# Patient Record
Sex: Male | Born: 1975 | Race: Black or African American | Hispanic: No | Marital: Single | Smoking: Current every day smoker
Health system: Southern US, Community
[De-identification: ages and names within clinical notes are randomized; demographics above are authoritative.]

## PROBLEM LIST (undated history)

## (undated) DIAGNOSIS — E119 Type 2 diabetes mellitus without complications: Secondary | ICD-10-CM

## (undated) DIAGNOSIS — I10 Essential (primary) hypertension: Secondary | ICD-10-CM

## (undated) DIAGNOSIS — E785 Hyperlipidemia, unspecified: Secondary | ICD-10-CM

---

## 2001-12-11 ENCOUNTER — Encounter: Payer: Self-pay | Admitting: *Deleted

## 2001-12-11 ENCOUNTER — Emergency Department (HOSPITAL_COMMUNITY): Admission: EM | Admit: 2001-12-11 | Discharge: 2001-12-12 | Payer: Self-pay | Admitting: *Deleted

## 2001-12-28 ENCOUNTER — Emergency Department (HOSPITAL_COMMUNITY): Admission: EM | Admit: 2001-12-28 | Discharge: 2001-12-28 | Payer: Self-pay | Admitting: Emergency Medicine

## 2002-06-27 ENCOUNTER — Emergency Department (HOSPITAL_COMMUNITY): Admission: EM | Admit: 2002-06-27 | Discharge: 2002-06-27 | Payer: Self-pay | Admitting: Emergency Medicine

## 2002-07-15 ENCOUNTER — Emergency Department (HOSPITAL_COMMUNITY): Admission: EM | Admit: 2002-07-15 | Discharge: 2002-07-15 | Payer: Self-pay | Admitting: Emergency Medicine

## 2002-07-15 ENCOUNTER — Encounter: Payer: Self-pay | Admitting: Radiology

## 2002-07-18 ENCOUNTER — Ambulatory Visit (HOSPITAL_COMMUNITY): Admission: RE | Admit: 2002-07-18 | Discharge: 2002-07-18 | Payer: Self-pay | Admitting: Oral Surgery

## 2005-05-18 ENCOUNTER — Emergency Department (HOSPITAL_COMMUNITY): Admission: EM | Admit: 2005-05-18 | Discharge: 2005-05-18 | Payer: Self-pay | Admitting: Emergency Medicine

## 2008-03-25 ENCOUNTER — Emergency Department (HOSPITAL_COMMUNITY): Admission: EM | Admit: 2008-03-25 | Discharge: 2008-03-25 | Payer: Self-pay | Admitting: Emergency Medicine

## 2008-05-30 ENCOUNTER — Emergency Department (HOSPITAL_COMMUNITY): Admission: EM | Admit: 2008-05-30 | Discharge: 2008-05-30 | Payer: Self-pay | Admitting: Emergency Medicine

## 2008-10-20 ENCOUNTER — Emergency Department (HOSPITAL_COMMUNITY): Admission: EM | Admit: 2008-10-20 | Discharge: 2008-10-20 | Payer: Self-pay | Admitting: Emergency Medicine

## 2009-06-20 ENCOUNTER — Emergency Department (HOSPITAL_COMMUNITY): Admission: EM | Admit: 2009-06-20 | Discharge: 2009-06-20 | Payer: Self-pay | Admitting: Emergency Medicine

## 2010-01-13 ENCOUNTER — Emergency Department (HOSPITAL_COMMUNITY): Admission: EM | Admit: 2010-01-13 | Discharge: 2010-01-13 | Payer: Self-pay | Admitting: Emergency Medicine

## 2010-02-23 ENCOUNTER — Emergency Department (HOSPITAL_COMMUNITY): Admission: EM | Admit: 2010-02-23 | Discharge: 2010-02-23 | Payer: Self-pay | Admitting: Emergency Medicine

## 2010-05-18 ENCOUNTER — Emergency Department (HOSPITAL_COMMUNITY): Admission: EM | Admit: 2010-05-18 | Discharge: 2010-05-18 | Payer: Self-pay | Admitting: Emergency Medicine

## 2010-08-06 ENCOUNTER — Emergency Department (HOSPITAL_COMMUNITY)
Admission: EM | Admit: 2010-08-06 | Discharge: 2010-08-06 | Payer: Self-pay | Source: Home / Self Care | Admitting: Emergency Medicine

## 2010-08-19 ENCOUNTER — Emergency Department (HOSPITAL_COMMUNITY): Admission: EM | Admit: 2010-08-19 | Discharge: 2010-08-19 | Payer: Self-pay | Admitting: Emergency Medicine

## 2011-02-08 LAB — URINALYSIS, ROUTINE W REFLEX MICROSCOPIC
Bilirubin Urine: NEGATIVE
Glucose, UA: NEGATIVE mg/dL
Hgb urine dipstick: NEGATIVE
Ketones, ur: NEGATIVE mg/dL
pH: 6 (ref 5.0–8.0)

## 2011-02-11 LAB — BASIC METABOLIC PANEL
BUN: 6 mg/dL (ref 6–23)
BUN: 10 mg/dL (ref 6–23)
CO2: 26 mEq/L (ref 19–32)
CO2: 25 mEq/L (ref 19–32)
Calcium: 9.5 mg/dL (ref 8.4–10.5)
Chloride: 103 mEq/L (ref 96–112)
Creatinine, Ser: 1.09 mg/dL (ref 0.4–1.5)
GFR calc non Af Amer: >60 mL/min (ref >60)
Glucose, Bld: 89 mg/dL (ref 70–99)
Glucose, Bld: 93 mg/dL (ref 70–99)
Potassium: 3.1 mEq/L — ABNORMAL LOW (ref 3.5–5.1)
Potassium: 3.6 mEq/L (ref 3.5–5.1)

## 2011-02-11 LAB — CBC
HCT: 44.8 % (ref 39.0–52.0)
HCT: 44.8 % (ref 39.0–52.0)
Hemoglobin: 15.5 g/dL (ref 13.0–17.0)
MCHC: 34.5 g/dL (ref 30.0–36.0)
MCHC: 33.7 g/dL (ref 30.0–36.0)
MCV: 90.7 fL (ref 78.0–100.0)
Platelets: 152 10*3/uL (ref 150–400)
RDW: 13.4 % (ref 11.5–15.5)
RDW: 13.5 % (ref 11.5–15.5)

## 2011-02-11 LAB — ETHANOL: Alcohol, Ethyl (B): 143 mg/dL — ABNORMAL HIGH (ref 0–10)

## 2011-02-11 LAB — DIFFERENTIAL
Basophils Absolute: 0.1 10*3/uL (ref 0.0–0.1)
Basophils Relative: 1 % (ref 0–1)
Basophils Relative: 2 % — ABNORMAL HIGH (ref 0–1)
Eosinophils Absolute: 0.1 10*3/uL (ref 0.0–0.7)
Eosinophils Relative: 1 % (ref 0–5)
Eosinophils Relative: 1 % (ref 0–5)
Monocytes Absolute: 1 10*3/uL (ref 0.1–1.0)
Monocytes Relative: 9 % (ref 3–12)
Neutrophils Relative %: 45 % (ref 43–77)

## 2011-02-11 LAB — RAPID URINE DRUG SCREEN, HOSP PERFORMED
Barbiturates: NOT DETECTED
Barbiturates: NOT DETECTED
Benzodiazepines: NOT DETECTED
Cocaine: POSITIVE — AB
Cocaine: POSITIVE — AB
Opiates: NOT DETECTED
Opiates: NOT DETECTED
Tetrahydrocannabinol: NOT DETECTED

## 2011-02-11 LAB — POCT CARDIAC MARKERS
CKMB, poc: 2.2 ng/mL (ref 1.0–8.0)
Troponin i, poc: <0.05 ng/mL (ref 0.00–0.09)

## 2011-03-01 LAB — URINALYSIS, ROUTINE W REFLEX MICROSCOPIC
Glucose, UA: NEGATIVE mg/dL
Ketones, ur: NEGATIVE mg/dL
Leukocytes, UA: NEGATIVE
Protein, ur: 100 mg/dL — AB
Urobilinogen, UA: 0.2 mg/dL (ref 0.0–1.0)

## 2011-03-01 LAB — DIFFERENTIAL
Basophils Relative: 0 % (ref 0–1)
Eosinophils Absolute: 0 10*3/uL (ref 0.0–0.7)
Eosinophils Relative: 0 % (ref 0–5)
Lymphs Abs: 1.4 10*3/uL (ref 0.7–4.0)
Monocytes Relative: 9 % (ref 3–12)

## 2011-03-01 LAB — CBC
HCT: 44 % (ref 39.0–52.0)
MCHC: 34.6 g/dL (ref 30.0–36.0)
MCV: 88.8 fL (ref 78.0–100.0)
RBC: 4.95 MIL/uL (ref 4.22–5.81)
WBC: 10.2 10*3/uL (ref 4.0–10.5)

## 2011-03-01 LAB — URINE MICROSCOPIC-ADD ON

## 2011-03-01 LAB — BASIC METABOLIC PANEL
BUN: 11 mg/dL (ref 6–23)
CO2: 25 mEq/L (ref 19–32)
Chloride: 110 mEq/L (ref 96–112)
Creatinine, Ser: 1.54 mg/dL — ABNORMAL HIGH (ref 0.4–1.5)
GFR calc Af Amer: >60 mL/min (ref >60)
Potassium: 3.4 mEq/L — ABNORMAL LOW (ref 3.5–5.1)

## 2011-04-10 NOTE — Op Note (Signed)
NAME:  Cristian White, Cristian White                           ACCOUNT NO.:  [id number]   MEDICAL RECORD NO.:  [id number]                   PATIENT TYPE:  OIB   LOCATION:  2899                                 FACILITY:  [facility]   PHYSICIAN:  Georgia Lopes, M.D.               DATE OF BIRTH:  27-Dec-1975   DATE OF PROCEDURE:  07/18/2002  DATE OF DISCHARGE:  07/18/2002                                 OPERATIVE REPORT   PREOPERATIVE DIAGNOSES:  Fractured right mandible, subcondylar,  nonrestorable tooth #31.   POSTOPERATIVE DIAGNOSES:  Fractured right mandible, subcondylar,  nonrestorable tooth #31, nonrestorable impacted tooth #32.   OPERATION PERFORMED:  Removal of teeth numbers 31, 32, closed reduction of  mandible fracture.   SURGEON:  Georgia Lopes, M.D.   ANESTHESIA:  General nasal.   ANESTHESIOLOGIST:  Cliffton Asters. Crews, M.D.   DESCRIPTION OF PROCEDURE:  The patient was taken to the operating room and  placed on the table in supine position.  General anesthesia was administered  intravenously and nasal endotracheal tube was placed atraumatically.  The  eyes were lubricated and protected.  A throat pack was placed and the  patient was prepped and draped for surgery.  Lidocaine 2% 1:100,000  epinephrine was infiltrated in an inferior alveolar block on the right and  left sides and then buccal infiltration of the maxilla.  A total of 5  carpules were used.  1.8 cc per carpule.  A bite block was placed on the  left side of the mouth.  Attention was turned to tooth #31.  A #15 blade was  used to make a full thickness incision around this tooth.  The periosteum  was reflected over the periosteal elevator.  The tooth was elevated with a  301 elevator and was removed from the mouth with a #17 forcep.  After the  tooth was removed, the socket was curetted.  It was noted that tooth #32 was  horizontally impacted and a large portion of the crown was decayed  subgingivally.  It was therefore elected  at this time to remove this tooth.  A  #15 blade was used to make a full thickness incision overlying the tooth,  the periosteum was reflected and then a 701 drill was used to remove bone  around the tooth.  The tooth was removed in multiple sections.  The socket  was then curetted and irrigated and the area was closed with 3-0 chromic.  Then arch bars were __________ the upper arch using 24 and 26 gauge wires.  24 gauge wires were used in the molars, canines and premolars and 26 gauge  wire was used in the anterior teeth.  Then an arch bar was adapted to the  lower jaw in the same fashion.  Then the mandible was manually distracted  inferiorly at the right angle and lesser curvature area of the mandible in  an  attempt to manipulate and reduce a condylar fracture.  This maneuver was  performed several times.  Bite was opened and closed and the teeth were  found to go into occlusion properly.  Then the oral cavity was irrigated  copiously and the throat pack was removed.  Elastic fixation was used times  approximately 12 to 15 rubber bands to close the mouth into intermaxillary  fixation.  The patient was then awakened and taken to the recovery room  breathing spontaneously and in good condition.   ESTIMATED BLOOD LOSS:  Minimum.    COMPLICATIONS:  None.   SPECIMENS:  None.                                                 Georgia Lopes, M.D.    SMJ/MEDQ  D:  07/18/2002  T:  07/20/2002  Job:  [id number]

## 2011-12-22 ENCOUNTER — Encounter (HOSPITAL_COMMUNITY): Payer: Self-pay | Admitting: Emergency Medicine

## 2011-12-22 ENCOUNTER — Emergency Department (HOSPITAL_COMMUNITY): Payer: Self-pay

## 2011-12-22 ENCOUNTER — Emergency Department (HOSPITAL_COMMUNITY)
Admission: EM | Admit: 2011-12-22 | Discharge: 2011-12-22 | Disposition: A | Discharge status: Home / Self Care | Payer: Self-pay | Attending: Emergency Medicine | Admitting: Emergency Medicine

## 2011-12-22 DIAGNOSIS — F17200 Nicotine dependence, unspecified, uncomplicated: Secondary | ICD-10-CM | POA: Insufficient documentation

## 2011-12-22 DIAGNOSIS — W260XXA Contact with knife, initial encounter: Secondary | ICD-10-CM | POA: Insufficient documentation

## 2011-12-22 DIAGNOSIS — Z23 Encounter for immunization: Secondary | ICD-10-CM | POA: Insufficient documentation

## 2011-12-22 DIAGNOSIS — S51819A Laceration without foreign body of unspecified forearm, initial encounter: Secondary | ICD-10-CM

## 2011-12-22 DIAGNOSIS — S51809A Unspecified open wound of unspecified forearm, initial encounter: Secondary | ICD-10-CM | POA: Insufficient documentation

## 2011-12-22 DIAGNOSIS — W261XXA Contact with sword or dagger, initial encounter: Secondary | ICD-10-CM | POA: Insufficient documentation

## 2011-12-22 MED ORDER — LIDOCAINE HCL (PF) 1 % IJ SOLN
INTRAMUSCULAR | Status: AC
  Filled 2011-12-22: qty 5

## 2011-12-22 MED ORDER — HYDROMORPHONE HCL PF 1 MG/ML IJ SOLN
INTRAMUSCULAR | Status: AC
  Administered 2011-12-22: 1 mg
  Filled 2011-12-22: qty 1

## 2011-12-22 MED ORDER — ONDANSETRON HCL 4 MG/2ML IJ SOLN
INTRAMUSCULAR | Status: AC
  Administered 2011-12-22: 19:00:00
  Filled 2011-12-22: qty 2

## 2011-12-22 MED ORDER — HYDROMORPHONE HCL PF 1 MG/ML IJ SOLN
1.0000 mg | Freq: Once | INTRAMUSCULAR | Status: AC
  Administered 2011-12-22: 1 mg via INTRAVENOUS
  Filled 2011-12-22: qty 1

## 2011-12-22 MED ORDER — CEFAZOLIN SODIUM 1-5 GM-% IV SOLN
1.0000 g | Freq: Three times a day (TID) | INTRAVENOUS | Status: DC
  Administered 2011-12-22: 1 g via INTRAVENOUS
  Filled 2011-12-22: qty 50

## 2011-12-22 MED ORDER — BACITRACIN ZINC 500 UNIT/GM EX OINT
TOPICAL_OINTMENT | CUTANEOUS | Status: AC
  Filled 2011-12-22: qty 0.9

## 2011-12-22 MED ORDER — LIDOCAINE HCL (PF) 1 % IJ SOLN
INTRAMUSCULAR | Status: AC
  Filled 2011-12-22: qty 10

## 2011-12-22 MED ORDER — TETANUS-DIPHTH-ACELL PERTUSSIS 5-2.5-18.5 LF-MCG/0.5 IM SUSP
0.5000 mL | Freq: Once | INTRAMUSCULAR | Status: AC
  Administered 2011-12-22: 0.5 mL via INTRAMUSCULAR
  Filled 2011-12-22 (×2): qty 0.5

## 2011-12-22 MED ORDER — CEPHALEXIN 500 MG PO CAPS: 500.0000 mg | ORAL_CAPSULE | Freq: Four times a day (QID) | ORAL | Status: AC

## 2011-12-22 MED ORDER — OXYCODONE-ACETAMINOPHEN 5-325 MG PO TABS: 1.0000 | ORAL_TABLET | Freq: Four times a day (QID) | ORAL | Status: AC | PRN

## 2011-12-22 NOTE — ED Provider Notes (Signed)
History   CSN: [id number]  Arrival date & time 12/22/11  1751   First MD Initiated Contact with Patient 12/22/11 1846      Chief Complaint  Patient presents with  . Laceration    (Consider location/radiation/quality/duration/timing/severity/associated sxs/prior treatment) HPI Comments: Was cutting gutters with utility knife which slipped and caused a large laceration to the forearm.  Last tetanus unknown.    Patient is a 36 y.o. male presenting with skin laceration. The history is provided by the patient.  Laceration  The incident occurred less than 1 hour ago. The laceration is located on the left arm. The laceration is 10 cm in size. The laceration mechanism was a a clean knife. The pain is severe. The pain has been constant since onset. He reports no foreign bodies present. His tetanus status is out of date.   Cristian White is a 36 y.o. male who presents to the Emergency Department complaining of laceration to the left arm.  History reviewed. No pertinent past medical history.  History reviewed. No pertinent past surgical history.  History reviewed. No pertinent family history.  History  Substance Use Topics  . Smoking status: Current Everyday Smoker  . Smokeless tobacco: Not on file  . Alcohol Use: Yes      Review of Systems  All other systems reviewed and are negative.   10 Systems reviewed and are negative for acute change except as noted in the HPI.  Allergies  Tylenol pm extra  Home Medications  No current outpatient prescriptions on file.  BP 121/84  Pulse 84  Temp 98.1 F (36.7 C)  Resp 21  Ht 5\' 5"  (1.651 m)  Wt 180 lb (81.647 kg)  BMI 29.95 kg/m2  SpO2 100%  Physical Exam  Nursing note and vitals reviewed. Constitutional: He is oriented to person, place, and time. He appears well-developed and well-nourished.  HENT:  Head: Normocephalic and atraumatic.  Neck: Normal range of motion. Neck supple.  Musculoskeletal:       The left forearm has  a large laceration on the volar aspect.  It is gaping and extends to, but not involving the musculature, vasculature, or tendons of the forearm.  He has full strength and range of motion of all fingers, thumb, wrist.  The ulnar and radial pulses are easily palpable, and sensation and motor are intact in the distal extremity.  Neurological: He is alert and oriented to person, place, and time.  Skin: Skin is warm and dry.    ED Course  Procedures (including critical care time)  DIAGNOSTIC STUDIES: Oxygen Saturation is 99% on room air, normal by my interpretation.    COORDINATION OF CARE:    Labs Reviewed - No data to display Dg Forearm Left  12/22/2011  *RADIOLOGY REPORT*  Clinical Data: Left arm laceration.  LEFT FOREARM - 2 VIEW  Comparison: 08/06/2010 hand radiographs.  Findings: No radiopaque foreign body.  No fracture.  Bandage material is present over the volar aspect of the mid forearm. Lucency is present in the soft tissues compatible with gas from laceration.  IMPRESSION: No acute osseous abnormality.  Volar forearm laceration.  Original Report Authenticated By: Andreas Newport, M.D.     No diagnosis found.  LACERATION REPAIR Performed by: Geoffery Lyons Authorized by: Geoffery Lyons Consent: Verbal consent obtained. Risks and benefits: risks, benefits and alternatives were discussed Consent given by: patient Patient identity confirmed: provided demographic data Prepped and Draped in normal sterile fashion Wound explored  Laceration Location: left forearm  Laceration Length: 10 cm  No Foreign Bodies seen or palpated  Anesthesia: local infiltration  Local anesthetic: lidocaine 1% without epinephrine  Anesthetic total: 10 ml  Irrigation method: syringe Amount of cleaning: standard  Skin closure: ethilon 4-0  Number of sutures: 15  Technique: simple interrupted  Patient tolerance: Patient tolerated the procedure well with no immediate complications.   MDM    Will discharge with keflex, pain meds and local wound care.  He is to have sutures removed in 2 weeks.           Geoffery Lyons, MD 12/22/11 2020

## 2011-12-22 NOTE — ED Notes (Signed)
Pt verbalizes understanding on wound care questions answered by RN and MD

## 2011-12-22 NOTE — ED Notes (Signed)
Education provided to pt about wound care and dressing instruction. Wound cleaning, signs/sx of infection provided. Pt verbalized understanding

## 2011-12-22 NOTE — ED Notes (Signed)
Pt accidentally cut left arm with straight razor. Pt able to make fist -c/o numbness in last two fingers.

## 2013-11-11 ENCOUNTER — Emergency Department (HOSPITAL_COMMUNITY)
Admission: EM | Admit: 2013-11-11 | Discharge: 2013-11-11 | Disposition: A | Discharge status: Home / Self Care | Payer: PRIVATE HEALTH INSURANCE | Attending: Emergency Medicine | Admitting: Emergency Medicine

## 2013-11-11 ENCOUNTER — Emergency Department (HOSPITAL_COMMUNITY): Payer: PRIVATE HEALTH INSURANCE

## 2013-11-11 ENCOUNTER — Encounter (HOSPITAL_COMMUNITY): Payer: Self-pay | Admitting: Emergency Medicine

## 2013-11-11 DIAGNOSIS — S20229A Contusion of unspecified back wall of thorax, initial encounter: Secondary | ICD-10-CM | POA: Insufficient documentation

## 2013-11-11 DIAGNOSIS — S20219A Contusion of unspecified front wall of thorax, initial encounter: Secondary | ICD-10-CM | POA: Insufficient documentation

## 2013-11-11 DIAGNOSIS — S0033XA Contusion of nose, initial encounter: Secondary | ICD-10-CM

## 2013-11-11 DIAGNOSIS — S20222A Contusion of left back wall of thorax, initial encounter: Secondary | ICD-10-CM

## 2013-11-11 DIAGNOSIS — S0083XA Contusion of other part of head, initial encounter: Secondary | ICD-10-CM

## 2013-11-11 DIAGNOSIS — F17200 Nicotine dependence, unspecified, uncomplicated: Secondary | ICD-10-CM | POA: Insufficient documentation

## 2013-11-11 DIAGNOSIS — S0003XA Contusion of scalp, initial encounter: Secondary | ICD-10-CM | POA: Insufficient documentation

## 2013-11-11 LAB — URINALYSIS, ROUTINE W REFLEX MICROSCOPIC
Bilirubin Urine: NEGATIVE
Hgb urine dipstick: NEGATIVE
Nitrite: NEGATIVE
Specific Gravity, Urine: <1.005 — ABNORMAL LOW (ref 1.005–1.030)
pH: 5.5 (ref 5.0–8.0)

## 2013-11-11 MED ORDER — IBUPROFEN 800 MG PO TABS
800.0000 mg | ORAL_TABLET | Freq: Once | ORAL | Status: AC
  Administered 2013-11-11: 800 mg via ORAL
  Filled 2013-11-11: qty 1

## 2013-11-11 MED ORDER — TRAMADOL-ACETAMINOPHEN 37.5-325 MG PO TABS: ORAL_TABLET | ORAL | Status: DC

## 2013-11-11 MED ORDER — METHOCARBAMOL 500 MG PO TABS: ORAL_TABLET | ORAL | Status: DC

## 2013-11-11 MED ORDER — NAPROXEN 500 MG PO TABS: 500.0000 mg | ORAL_TABLET | Freq: Two times a day (BID) | ORAL | Status: DC

## 2013-11-11 MED ORDER — TRAMADOL HCL 50 MG PO TABS
100.0000 mg | ORAL_TABLET | Freq: Once | ORAL | Status: AC
  Administered 2013-11-11: 100 mg via ORAL
  Filled 2013-11-11: qty 2

## 2013-11-11 MED ORDER — ACETAMINOPHEN 500 MG PO TABS
1000.0000 mg | ORAL_TABLET | Freq: Once | ORAL | Status: AC
  Administered 2013-11-11: 1000 mg via ORAL
  Filled 2013-11-11: qty 2
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
## 2013-11-11 NOTE — ED Notes (Signed)
Pt instructed on need for urine sample and verbalized understanding

## 2013-12-08 ENCOUNTER — Emergency Department (HOSPITAL_COMMUNITY)
Admission: EM | Admit: 2013-12-08 | Discharge: 2013-12-08 | Disposition: A | Discharge status: Home / Self Care | Payer: PRIVATE HEALTH INSURANCE | Attending: Emergency Medicine | Admitting: Emergency Medicine

## 2013-12-08 ENCOUNTER — Emergency Department (HOSPITAL_COMMUNITY): Payer: PRIVATE HEALTH INSURANCE

## 2013-12-08 ENCOUNTER — Encounter (HOSPITAL_COMMUNITY): Payer: Self-pay | Admitting: Emergency Medicine

## 2013-12-08 DIAGNOSIS — F17200 Nicotine dependence, unspecified, uncomplicated: Secondary | ICD-10-CM | POA: Insufficient documentation

## 2013-12-08 DIAGNOSIS — Z791 Long term (current) use of non-steroidal anti-inflammatories (NSAID): Secondary | ICD-10-CM | POA: Insufficient documentation

## 2013-12-08 DIAGNOSIS — R112 Nausea with vomiting, unspecified: Secondary | ICD-10-CM | POA: Insufficient documentation

## 2013-12-08 LAB — CBC WITH DIFFERENTIAL/PLATELET
BASOS ABS: 0 10*3/uL (ref 0.0–0.1)
BASOS PCT: 0 % (ref 0–1)
EOS PCT: 0 % (ref 0–5)
Eosinophils Absolute: 0 10*3/uL (ref 0.0–0.7)
HEMATOCRIT: 44.2 % (ref 39.0–52.0)
Hemoglobin: 15.9 g/dL (ref 13.0–17.0)
Lymphocytes Relative: 34 % (ref 12–46)
Lymphs Abs: 3.7 10*3/uL (ref 0.7–4.0)
MCH: 31.9 pg (ref 26.0–34.0)
MCHC: 36 g/dL (ref 30.0–36.0)
MCV: 88.6 fL (ref 78.0–100.0)
MONO ABS: 1.2 10*3/uL — AB (ref 0.1–1.0)
Monocytes Relative: 10 % (ref 3–12)
NEUTROS ABS: 6.2 10*3/uL (ref 1.7–7.7)
Neutrophils Relative %: 56 % (ref 43–77)
PLATELETS: 151 10*3/uL (ref 150–400)
RBC: 4.99 MIL/uL (ref 4.22–5.81)
RDW: 12.9 % (ref 11.5–15.5)
WBC: 11.1 10*3/uL — ABNORMAL HIGH (ref 4.0–10.5)

## 2013-12-08 LAB — COMPREHENSIVE METABOLIC PANEL
ALBUMIN: 4.6 g/dL (ref 3.5–5.2)
ALT: 23 U/L (ref 0–53)
AST: 25 U/L (ref 0–37)
Alkaline Phosphatase: 75 U/L (ref 39–117)
BUN: 10 mg/dL (ref 6–23)
CALCIUM: 9.9 mg/dL (ref 8.4–10.5)
CHLORIDE: 99 meq/L (ref 96–112)
CO2: 27 mEq/L (ref 19–32)
CREATININE: 1.1 mg/dL (ref 0.50–1.35)
GFR calc Af Amer: >90 mL/min (ref >90)
GFR calc non Af Amer: 85 mL/min — ABNORMAL LOW (ref >90)
Glucose, Bld: 82 mg/dL (ref 70–99)
Potassium: 4.4 mEq/L (ref 3.7–5.3)
SODIUM: 142 meq/L (ref 137–147)
Total Bilirubin: 1.2 mg/dL (ref 0.3–1.2)
Total Protein: 7.8 g/dL (ref 6.0–8.3)

## 2013-12-08 LAB — LIPASE, BLOOD: Lipase: 17 U/L (ref 11–59)

## 2013-12-08 MED ORDER — ONDANSETRON 8 MG PO TBDP
8.0000 mg | ORAL_TABLET | Freq: Once | ORAL | Status: AC
  Administered 2013-12-08: 8 mg via ORAL
  Filled 2013-12-08: qty 1

## 2013-12-08 MED ORDER — ONDANSETRON HCL 4 MG PO TABS: 4.0000 mg | ORAL_TABLET | Freq: Three times a day (TID) | ORAL | Status: DC | PRN

## 2013-12-08 NOTE — ED Provider Notes (Signed)
CSN: [id number]     Arrival date & time 12/08/13  1824 History   First MD Initiated Contact with Patient 12/08/13 2037     Chief Complaint  Patient presents with  . Emesis    HPI Pt was seen at 2035. Per pt, c/o gradual onset and persistence of multiple intermittent episodes of N/V that began this morning. States he has been able to tolerate a small amount PO in between N/V episodes. Denies diarrhea, no abd pain, no CP/SOB, no back pain, no fevers, no black or blood in stools or emesis.     History reviewed. No pertinent past medical history.  History reviewed. No pertinent past surgical history.  History  Substance Use Topics  . Smoking status: Current Every Day Smoker    Types: Cigarettes  . Smokeless tobacco: Not on file  . Alcohol Use: Yes    Review of Systems ROS: Statement: All systems negative except as marked or noted in the HPI; Constitutional: Negative for fever and chills. ; ; Eyes: Negative for eye pain, redness and discharge. ; ; ENMT: Negative for ear pain, hoarseness, nasal congestion, sinus pressure and sore throat. ; ; Cardiovascular: Negative for chest pain, palpitations, diaphoresis, dyspnea and peripheral edema. ; ; Respiratory: Negative for cough, wheezing and stridor. ; ; Gastrointestinal: +N/V. Negative for diarrhea, abdominal pain, blood in stool, hematemesis, jaundice and rectal bleeding. . ; ; Genitourinary: Negative for dysuria, flank pain and hematuria. ; ; Musculoskeletal: Negative for back pain and neck pain. Negative for swelling and trauma.; ; Skin: Negative for pruritus, rash, abrasions, blisters, bruising and skin lesion.; ; Neuro: Negative for headache, lightheadedness and neck stiffness. Negative for weakness, altered level of consciousness , altered mental status, extremity weakness, paresthesias, involuntary movement, seizure and syncope.       Allergies  Diphenhydramine-apap (sleep)  Home Medications   Current Outpatient Rx  Name  Route  Sig   Dispense  Refill  . methocarbamol (ROBAXIN) 500 MG tablet      Take 2 po QID for muscle soreness or spasm   60 tablet   0   . naproxen (NAPROSYN) 500 MG tablet   Oral   Take 1 tablet (500 mg total) by mouth 2 (two) times daily.   30 tablet   0   . traMADol-acetaminophen (ULTRACET) 37.5-325 MG per tablet      2 tabs po QID prn pain   16 tablet   0    BP 130/78  Pulse 97  Temp(Src) 98.6 F (37 C)  Resp 20  Ht 5\' 5"  (1.651 m)  Wt 180 lb (81.647 kg)  BMI 29.95 kg/m2  SpO2 97% Physical Exam 2040: Physical examination:  Nursing notes reviewed; Vital signs and O2 SAT reviewed;  Constitutional: Well developed, Well nourished, Well hydrated, In no acute distress; Head:  Normocephalic, atraumatic; Eyes: EOMI, PERRL, No scleral icterus; ENMT: Mouth and pharynx normal, Mucous membranes moist; Neck: Supple, Full range of motion, No lymphadenopathy; Cardiovascular: Regular rate and rhythm, No murmur, rub, or gallop; Respiratory: Breath sounds clear & equal bilaterally, No rales, rhonchi, wheezes.  Speaking full sentences with ease, Normal respiratory effort/excursion; Chest: Nontender, Movement normal; Abdomen: Soft, Nontender, Nondistended, Normal bowel sounds; Genitourinary: No CVA tenderness; Extremities: Pulses normal, No tenderness, No edema, No calf edema or asymmetry.; Neuro: AA&Ox3, Major CN grossly intact.  Speech clear. No gross focal motor or sensory deficits in extremities. Climbs on and off stretcher easily by himself. Gait steady.; Skin: Color normal, Warm, Dry.  ED Course  Procedures   EKG Interpretation   None       MDM  MDM Reviewed: previous chart, nursing note and vitals Reviewed previous: labs Interpretation: labs and x-ray     Results for orders placed during the hospital encounter of 12/08/13  CBC WITH DIFFERENTIAL      Result Value Range   WBC 11.1 (*) 4.0 - 10.5 K/uL   RBC 4.99  4.22 - 5.81 MIL/uL   Hemoglobin 15.9  13.0 - 17.0 g/dL   HCT 45.4  09.8  - 11.9 %   MCV 88.6  78.0 - 100.0 fL   MCH 31.9  26.0 - 34.0 pg   MCHC 36.0  30.0 - 36.0 g/dL   RDW 14.7  82.9 - 56.2 %   Platelets 151  150 - 400 K/uL   Neutrophils Relative % 56  43 - 77 %   Neutro Abs 6.2  1.7 - 7.7 K/uL   Lymphocytes Relative 34  12 - 46 %   Lymphs Abs 3.7  0.7 - 4.0 K/uL   Monocytes Relative 10  3 - 12 %   Monocytes Absolute 1.2 (*) 0.1 - 1.0 K/uL   Eosinophils Relative 0  0 - 5 %   Eosinophils Absolute 0.0  0.0 - 0.7 K/uL   Basophils Relative 0  0 - 1 %   Basophils Absolute 0.0  0.0 - 0.1 K/uL  COMPREHENSIVE METABOLIC PANEL      Result Value Range   Sodium 142  137 - 147 mEq/L   Potassium 4.4  3.7 - 5.3 mEq/L   Chloride 99  96 - 112 mEq/L   CO2 27  19 - 32 mEq/L   Glucose, Bld 82  70 - 99 mg/dL   BUN 10  6 - 23 mg/dL   Creatinine, Ser 1.30  0.50 - 1.35 mg/dL   Calcium 9.9  8.4 - 86.5 mg/dL   Total Protein 7.8  6.0 - 8.3 g/dL   Albumin 4.6  3.5 - 5.2 g/dL   AST 25  0 - 37 U/L   ALT 23  0 - 53 U/L   Alkaline Phosphatase 75  39 - 117 U/L   Total Bilirubin 1.2  0.3 - 1.2 mg/dL   GFR calc non Af Amer 85 (*) >90 mL/min   GFR calc Af Amer >90  >90 mL/min  LIPASE, BLOOD      Result Value Range   Lipase 17  11 - 59 U/L   Dg Abd Acute W/chest 12/08/2013   CLINICAL DATA:  Nausea/vomiting  EXAM: ACUTE ABDOMEN SERIES (ABDOMEN 2 VIEW & CHEST 1 VIEW)  COMPARISON:  Chest/rib radiographs dated 11/11/2013  FINDINGS: Lungs are essentially clear. No focal consolidation. No pleural effusion or pneumothorax.  The heart is normal in size.  Nonobstructive bowel gas pattern.  No evidence of free air under the diaphragm on the upright view.  Calcifications overlying the bilateral pelvis, statistically reflecting calcified pelvic phleboliths.  Visualized osseous structures are within normal limits.  IMPRESSION: No evidence of acute cardiopulmonary disease.  No evidence of small bowel obstruction or free air.   Electronically Signed   By: Charline Bills M.D.   On: 12/08/2013 21:01     2155:  WBC count per baseline. Pt has tol PO well while in the ED without N/V.  No stooling while in the ED.  Abd remains benign, VSS. Feels better and wants to go home now. Will continue to tx symptomatically at this time.  Dx and testing d/w pt.  Questions answered.  Verb understanding, agreeable to d/c home with outpt f/u.      Laray AngerKathleen M Vilma Will, DO 12/11/13 1058

## 2013-12-08 NOTE — Discharge Instructions (Signed)
°Emergency Department Resource Guide °1) Find a Doctor and Pay Out of Pocket °Although you won't have to find out who is covered by your insurance plan, it is a good idea to ask around and get recommendations. You will then need to call the office and see if the doctor you have chosen will accept you as a new patient and what types of options they offer for patients who are self-pay. Some doctors offer discounts or will set up payment plans for their patients who do not have insurance, but you will need to ask so you aren't surprised when you get to your appointment. ° °2) Contact Your Local Health Department °Not all health departments have doctors that can see patients for sick visits, but many do, so it is worth a call to see if yours does. If you don't know where your local health department is, you can check in your phone book. The CDC also has a tool to help you locate your state's health department, and many state websites also have listings of all of their local health departments. ° °3) Find a Walk-in Clinic °If your illness is not likely to be very severe or complicated, you may want to try a walk in clinic. These are popping up all over the country in pharmacies, drugstores, and shopping centers. They're usually staffed by nurse practitioners or physician assistants that have been trained to treat common illnesses and complaints. They're usually fairly quick and inexpensive. However, if you have serious medical issues or chronic medical problems, these are probably not your best option. ° °No Primary Care Doctor: °- Call [facility] at  832-8000 - they can help you locate a primary care doctor that  accepts your insurance, provides certain services, etc. °- Physician Referral Service- 1-800-533-3463 ° °Chronic Pain Problems: °Organization         Address  Phone   Notes  °[facility]  (336) 297-2271 Patients need to be referred by their primary care doctor.  ° °Medication  Assistance: °Organization         Address  Phone   Notes  °[place] [facility] [address] °[place], [place] [address] (336) 641-8030 --Must be a resident of [place] °-- Must have NO insurance coverage whatsoever (no Medicaid/ Medicare, etc.) °-- The pt. MUST have a primary care doctor that directs their care regularly and follows them in the community °  °[facility]  (866) 331-1348   °[employer]  (888) 892-1162   ° °Agencies that provide inexpensive medical care: °Organization         Address  Phone   Notes  °[facility]  (336) 832-8035   °[facility]    (336) 832-7272   °[facility] [address] °[place], [place] [address] (336) 832-4777   °[facility] [address], °[place] (336) 271-4999   °[facility]    (336) 373-0678   °[facility]    (336) 272-1050   °[facility] ° [address], [place] Phone:  (336) 832-4444, Fax:  (336) 832-4440 Hours of Operation:  9 am - 6 pm, M-F.  Also accepts Medicaid/Medicare and self-pay.  °[facility] ° [address], [place] Phone: (336) 832-3150, Fax: (336) [phone]. Hours of Operation:  8:30 am - 5:30 pm, M-F.  Also accepts Medicaid and self-pay.  °[facility] [place] [address], [place] Phone: (336) 878-6027   °[facility] [address], [place], [place] (336)723-1848, Ext. 123 Mondays & Thursdays: 7-9 AM.  First 15 patients are seen on a first come, first serve basis. °  ° °Medicaid-accepting [place] Providers: ° °Organization         Address  Phone   Notes  °[facility] [address], [place] (336) 641-2100 Also accepts self-pay patients.  °[facility] [address], [place] ° (336) 856-9996   °[facility] [address], [place]  (336) 288-8857   °[facility] [address] [place] [address], [place] (336) 299-7000   °Veita Bland [address], [place]  ° (336) 373-1557 Only accepts [place] Access Medicaid patients after they have their name applied to their card.  ° °Self-Pay (no insurance) in [place]: ° °Organization         Address  Phone   Notes  °Sickle Cell Patients, [facility] [address], [place] (336) 832-1970   °[facility] [address], [place] (336) 832-4400   °[facility] ° [address], [place] (336) 992-4800   °[facility]/Dr. Osei-Bonsu ° [address] [place] [address], [place] or [address], [place] (336) 841-8500 Phone number for both [place] and [place] locations is the same.  °[facility] [address], [place] (336) 299-0000   °[facility] [address] [place] [address], [place] or [address] (336) 852-7530 °(336) 878-2260   °[facility] [address], [place] (336) 350-1642, phone; (336) 294-5005, fax Sees patients 1st and 3rd Saturday of every month.  Must not qualify for public or private insurance (i.e. Medicaid, Medicare, [place] Health Choice, Veterans' Benefits) • Household income should be no more than 200% of the poverty level •The clinic cannot treat you if you are pregnant or think you are pregnant • Sexually transmitted diseases are not treated at the clinic.  ° ° °Dental Care: °Organization         Address  Phone  Notes  °[place] [facility] [address], [place] (336) 641-6152 Accepts children up to age 21 who are enrolled in Medicaid or [place] Health Choice; pregnant women with a Medicaid card; and children who have applied for Medicaid or [place] Health Choice, but were declined, whose parents can pay a reduced fee at time of service.  °[place]  [facility] [place]  [address], [place] (336) 641-7733 Accepts children up to age 21 who are enrolled in Medicaid or [place] Health Choice; pregnant women with a Medicaid card; and children who have applied for Medicaid or [place] Health Choice, but were declined, whose parents can pay a reduced fee at time of service.  °[facility] ° [address], [place] (336) 641-4533 Patients are seen by appointment only. Walk-ins are not accepted. [facility] will see patients 18 years of age and older. °Monday - Tuesday (8am-5pm) °Most Wednesdays (8:30-5pm) °$30 per visit, cash only  °[facility] ° [address], [place] (336) 641-4533 Patients are seen by appointment only. Walk-ins are not accepted. [facility] will see patients 18 years of age and older. °One   Wednesday Evening (Monthly: Volunteer Based).  $30 per visit, cash only  °[facility]  (919) 537-3737 for adults; Children under age 4, call Graduate Pediatric Dentistry at (919) [phone]. Children aged 4-14, please call (919) [phone] to request a pediatric application. ° Dental services are provided in all areas of dental care including fillings, crowns and bridges, complete and partial dentures, implants, gum treatment, root canals, and extractions. Preventive care is also provided. Treatment is provided to both adults and children. °Patients are selected via a lottery and there is often a waiting list. °  °[facility] [address], °[place] ° (336) 763-8833 [email or web address] °  °[facility] [address], [place], [place] (336)723-1848, Ext. 123 Second and Fourth Thursday of each month, opens at 6:30 AM; Clinic ends at 9 AM.  Patients are seen on a first-come first-served basis, and a limited number are seen during each clinic.  ° °[facility] ° [address], [place], [place] (336) 723-7904    Eligibility Requirements °You must have lived in [place], [place], or [place] counties for at least the last three months. °  You cannot be eligible for state or federal sponsored healthcare insurance, including Veterans Administration, Medicaid, or Medicare. °  You generally cannot be eligible for healthcare insurance through your employer.  °  How to apply: °Eligibility screenings are held every Tuesday and Wednesday afternoon from 1:00 pm until 4:00 pm. You do not need an appointment for the interview!  °[facility] [address], [place], [place] 336-631-2330   °[facility]  336-342-8273   °[facility]  336-703-3100   °[facility]  336-570-6415   ° °Behavioral Health Resources in the Community: °Intensive Outpatient Programs °Organization         Address  Phone  Notes  °[facility] [address], [place], [place] 336-878-6098   °[facility] [address], [place], [place] 336-832-9800   °[facility] [address], [place], [place] ° 336-882-2125   °[facility] [address],  °[place], [place] 1-800-853-5163 or 336-641-4981   °Substance Abuse Resources °Organization         Address  Phone  Notes  °[facility]  336-882-2125   °[facility]  336-784-9470   °[facility]  336-285-9073   °[facility]  336-845-3988   °Residential & Outpatient Substance Abuse Program  1-800-659-3381   °Psychological Services °Organization         Address  Phone  Notes  °[facility]  336- 832-9600   °[facility]  336- 378-7881   °[facility] [address], [place] 1-800-853-5163 or 336-641-4981   ° °Mobile Crisis Teams °Organization         Address  Phone  Notes  °[facility], Mobile Crisis Care Unit  1-877-626-1772   °Assertive °[facility] ° [address]  [place], [place] 336-834-9664   °Sharon DeEsch [address] °[place] 336-554-5454   ° °Self-Help/Support Groups °Organization         Address  Phone             Notes  °[facility] of  - variety of support groups  336- 373-1402 Call for more information  °Narcotics Anonymous (NA), [facility] [address], °[place]  2 meetings at this location  ° °  Residential Treatment Programs Organization         Address  Phone  Notes  [facility] [address],    [place]  [phone])[phone]   [facility]  [address], [place] [phone], [place], [place] [phone]) [phone]   [facility] [address], [place] [phone] Admissions: 8am-3pm M-F  [facility] [address]    [place], [place] [phone]   [facility] [address], [place], [place] [phone]   [facility] [address]  [place], [place] [phone]   [facility] Intensive Outpatient [address], [place], [place] [phone]   [facility] ([facility] [address]  [place], [place] [phone] or ([phone]) [phone]   [facility] [address] [place], [place] [phone] Accepts Medicaid  [facility] [address]  [place] [phone] Substance Abuse/Addiction Treatment   [facility] Organization         Address  Phone  Notes  [facility]  226-051-7750(888) [phone]   Angie FavaJulie Brannon, PhD [address], [address] [place], [place]   [phone](336) [phone] or [phone](336) [phone]   [facility]   [address] [place], [place] [phone]) [phone]   [facility] [address], [place], [place] [phone](336) [phone] Insurance/Medicaid/sponsorship through [facility] [address]                                    [place], [place] [phone](336) [phone] Therapy/tele-psych/case    [facility] [address]   [place], [place] [phone]) [phone]    Dr. Lolly MustacheArfeen  [phone]) [phone]   [facility]  [employer] [facility] 1) [address], [place] 2) [address], [place] 3)  [address], [place] [phone](336) [phone](336) [phone]  [phone](336) [phone]   [facility] Child Abuse Hotline [phone](336) [phone] or [phone](336) [phone] (After Hours)      Take the prescription as directed.  Increase your fluid intake (ie:  Gatoraide) for the next few days, as discussed.  Eat a bland diet and advance to your regular diet slowly as you can tolerate it. Call your regular medical doctor Monday to schedule a follow up appointment this week.  Return to the Emergency Department immediately if not improving (or even worsening) despite taking the medicines as prescribed, any black or bloody stool or vomit, if you develop a fever over "101," or for any other concerns.

## 2013-12-08 NOTE — ED Notes (Signed)
Pt c/o vomiting off and on since 9am this morning.

## 2014-01-06 ENCOUNTER — Encounter (HOSPITAL_COMMUNITY): Payer: Self-pay | Admitting: Emergency Medicine

## 2014-01-06 ENCOUNTER — Emergency Department (HOSPITAL_COMMUNITY)
Admission: EM | Admit: 2014-01-06 | Discharge: 2014-01-06 | Disposition: A | Discharge status: Home / Self Care | Payer: PRIVATE HEALTH INSURANCE | Attending: Emergency Medicine | Admitting: Emergency Medicine

## 2014-01-06 DIAGNOSIS — F419 Anxiety disorder, unspecified: Secondary | ICD-10-CM

## 2014-01-06 DIAGNOSIS — F17200 Nicotine dependence, unspecified, uncomplicated: Secondary | ICD-10-CM | POA: Insufficient documentation

## 2014-01-06 DIAGNOSIS — F411 Generalized anxiety disorder: Secondary | ICD-10-CM | POA: Insufficient documentation

## 2014-01-06 DIAGNOSIS — Z791 Long term (current) use of non-steroidal anti-inflammatories (NSAID): Secondary | ICD-10-CM | POA: Insufficient documentation

## 2014-01-06 LAB — RAPID URINE DRUG SCREEN, HOSP PERFORMED
AMPHETAMINES: NOT DETECTED
Barbiturates: NOT DETECTED
Benzodiazepines: NOT DETECTED
COCAINE: POSITIVE — AB
OPIATES: NOT DETECTED
TETRAHYDROCANNABINOL: NOT DETECTED

## 2014-01-06 LAB — GLUCOSE, CAPILLARY: Glucose-Capillary: 89 mg/dL (ref 70–99)

## 2014-01-06 MED ORDER — LORAZEPAM 1 MG PO TABS
1.0000 mg | ORAL_TABLET | Freq: Once | ORAL | Status: AC
  Administered 2014-01-06: 1 mg via ORAL
  Filled 2014-01-06: qty 1

## 2014-01-06 MED ORDER — ONDANSETRON 8 MG PO TBDP
8.0000 mg | ORAL_TABLET | Freq: Once | ORAL | Status: AC
  Administered 2014-01-06: 8 mg via ORAL
  Filled 2014-01-06: qty 1

## 2014-01-06 MED ORDER — PROMETHAZINE HCL 25 MG PO TABS: 25.0000 mg | ORAL_TABLET | Freq: Four times a day (QID) | ORAL | Status: DC | PRN
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
## 2014-01-06 NOTE — Discharge Instructions (Signed)
Medication for nausea.  Increase fluids.  Rest. °

## 2014-08-04 ENCOUNTER — Emergency Department (HOSPITAL_COMMUNITY)
Admission: EM | Admit: 2014-08-04 | Discharge: 2014-08-04 | Disposition: A | Discharge status: Home / Self Care | Payer: PRIVATE HEALTH INSURANCE | Attending: Emergency Medicine | Admitting: Emergency Medicine

## 2014-08-04 ENCOUNTER — Encounter (HOSPITAL_COMMUNITY): Payer: Self-pay | Admitting: Emergency Medicine

## 2014-08-04 DIAGNOSIS — H9209 Otalgia, unspecified ear: Secondary | ICD-10-CM | POA: Diagnosis not present

## 2014-08-04 DIAGNOSIS — F17200 Nicotine dependence, unspecified, uncomplicated: Secondary | ICD-10-CM | POA: Diagnosis not present

## 2014-08-04 DIAGNOSIS — J039 Acute tonsillitis, unspecified: Secondary | ICD-10-CM | POA: Diagnosis not present

## 2014-08-04 DIAGNOSIS — R5381 Other malaise: Secondary | ICD-10-CM | POA: Insufficient documentation

## 2014-08-04 DIAGNOSIS — R5383 Other fatigue: Secondary | ICD-10-CM

## 2014-08-04 DIAGNOSIS — R42 Dizziness and giddiness: Secondary | ICD-10-CM | POA: Diagnosis not present

## 2014-08-04 DIAGNOSIS — J029 Acute pharyngitis, unspecified: Secondary | ICD-10-CM | POA: Diagnosis present

## 2014-08-04 MED ORDER — DEXAMETHASONE SODIUM PHOSPHATE 10 MG/ML IJ SOLN
10.0000 mg | Freq: Once | INTRAMUSCULAR | Status: AC
  Administered 2014-08-04: 10 mg via INTRAVENOUS
  Filled 2014-08-04: qty 1

## 2014-08-04 MED ORDER — SODIUM CHLORIDE 0.9 % IV BOLUS (SEPSIS)
1000.0000 mL | Freq: Once | INTRAVENOUS | Status: AC
  Administered 2014-08-04: 1000 mL via INTRAVENOUS

## 2014-08-04 MED ORDER — ONDANSETRON HCL 4 MG/2ML IJ SOLN
4.0000 mg | Freq: Once | INTRAMUSCULAR | Status: AC
  Administered 2014-08-04: 4 mg via INTRAVENOUS
  Filled 2014-08-04: qty 2

## 2014-08-04 MED ORDER — MORPHINE SULFATE 4 MG/ML IJ SOLN
4.0000 mg | Freq: Once | INTRAMUSCULAR | Status: AC
  Administered 2014-08-04: 4 mg via INTRAVENOUS
  Filled 2014-08-04: qty 1

## 2014-08-04 MED ORDER — HYDROCODONE-ACETAMINOPHEN 5-325 MG PO TABS: 1.0000 | ORAL_TABLET | ORAL | Status: DC | PRN

## 2014-08-04 MED ORDER — PENICILLIN G BENZATHINE 1200000 UNIT/2ML IM SUSP
1.2000 10*6.[IU] | Freq: Once | INTRAMUSCULAR | Status: AC
  Administered 2014-08-04: 1.2 10*6.[IU] via INTRAMUSCULAR
  Filled 2014-08-04: qty 2

## 2014-08-04 NOTE — ED Notes (Signed)
Pt reports sore throat x2 weeks.pt denies any known fevers. Airway patent. nad noted.

## 2014-08-04 NOTE — Discharge Instructions (Signed)

## 2014-08-04 NOTE — ED Provider Notes (Signed)
CSN: [id number]     Arrival date & time 08/04/14  1014 History   First MD Initiated Contact with Patient 08/04/14 1015     Chief Complaint  Patient presents with  . Sore Throat     (Consider location/radiation/quality/duration/timing/severity/associated sxs/prior Treatment) The history is provided by the patient.   Cristian White is a 38 y.o. male presenting with a two-week history of worsening sore throat and subjective fever.  He reports a mild sore throat which was well tolerated with the use of occasional BC powders but over the past several days his throat has become severely painful with increasing tonsillar swelling causing difficulty with eating and drinking.  He states his last real meal occurred 4 days ago.  He is able to smoke cigarettes but has struggled to drink fluids.  This morning as he was walking down his steps he became lightheaded and nearly passed out.  He denies falling, denies headaches, neck pain, chest pain, palpitations or shortness of breath.  He has had no coughing, no nasal congestion or postnasal drip.  He does endorse right ear pain which is worsened with attempts to swallow.     History reviewed. No pertinent past medical history. History reviewed. No pertinent past surgical history. History reviewed. No pertinent family history. History  Substance Use Topics  . Smoking status: Current Every Day Smoker -- 1.00 packs/day    Types: Cigarettes  . Smokeless tobacco: Not on file  . Alcohol Use: No    Review of Systems  Constitutional: Positive for fever and chills.  HENT: Positive for ear pain and sore throat. Negative for congestion, rhinorrhea, sinus pressure, trouble swallowing and voice change.   Eyes: Negative for discharge.  Respiratory: Negative for cough, shortness of breath, wheezing and stridor.   Cardiovascular: Negative for chest pain.  Gastrointestinal: Negative for abdominal pain.  Genitourinary: Negative.   Musculoskeletal: Negative.    Skin: Negative.  Negative for rash.  Neurological: Positive for weakness and light-headedness.      Allergies  Diphenhydramine-apap (sleep)  Home Medications   Prior to Admission medications   Medication Sig Start Date End Date Taking? Authorizing Provider  Aspirin-Acetaminophen (GOODYS BODY PAIN PO) Take 2 Packages by mouth 2 (two) times daily as needed (pain).   Yes Historical Provider, MD  HYDROcodone-acetaminophen (NORCO/VICODIN) 5-325 MG per tablet Take 1 tablet by mouth every 4 (four) hours as needed. 08/04/14   Burgess Amor, PA-C   BP 134/84  Pulse 76  Temp(Src) 99.2 F (37.3 C) (Oral)  Resp 18  Ht  (1.651 m)  Wt 175 lb (79.379 kg)  BMI 29.12 kg/m2  SpO2 100% Physical Exam  Constitutional: He is oriented to person, place, and time. He appears well-developed and well-nourished.  HENT:  Head: Normocephalic and atraumatic.  Right Ear: Tympanic membrane and ear canal normal.  Left Ear: Tympanic membrane and ear canal normal.  Nose: No mucosal edema or rhinorrhea.  Mouth/Throat: Uvula is midline and mucous membranes are normal. No uvula swelling. Oropharyngeal exudate, posterior oropharyngeal edema and posterior oropharyngeal erythema present. No tonsillar abscesses.  Bilateral tonsillar hypertrophy, right greater than left.  The right tonsil is mildly by everting the uvula to the left.  No obvious peritonsillar.  Patient's voice is slightly muffled in character.  He has bilateral tender and swollen tonsillar lymph nodes.  Eyes: Conjunctivae are normal.  Cardiovascular: Normal rate and normal heart sounds.   Pulmonary/Chest: Effort normal. No respiratory distress. He has no wheezes. He has no rales.  Abdominal: Soft. There is no tenderness.  Musculoskeletal: Normal range of motion.  Neurological: He is alert and oriented to person, place, and time.  Skin: Skin is warm and dry. No rash noted.  Psychiatric: He has a normal mood and affect.    ED Course  Procedures  (including critical care time) Labs Review Labs Reviewed - No data to display  Imaging Review No results found.   EKG Interpretation None      MDM   Final diagnoses:  Acute tonsillitis    Patient was also seen by Dr. Adriana Simas during this encounter.  Patient was given IV fluids, 2 L,  IV Decadron, morphine and Zofran.  He was also given IM Bicillin injection.  He has no overt peritonsillar abscess, but may have an early brewing abscess on the right.  He felt much improved at time of dc and tolerated po fluids prior to dc home.  He was given strict return precautions re any worsened pain, swelling, fevers.  Encouraged rest, increased fluid intake.  Prn f/u here.     Burgess Amor, PA-C 08/05/14 [phone]

## 2014-08-04 NOTE — ED Notes (Signed)
Pt drinking orange juice.c/o pain with swallowing but tolerating fluid po.

## 2014-08-06 NOTE — ED Provider Notes (Signed)
Medical screening examination/treatment/procedure(s) were conducted as a shared visit with non-physician practitioner(s) and myself.  I personally evaluated the patient during the encounter.   EKG Interpretation None     Sore throat for several days. Patient is able to drink fluids. Normal airways. Right tonsillar area slightly boggy but no evidence of tonsillar abscess at this time. Patient advised to return if symptoms are worsening.  Donnetta Hutching, MD 08/06/14 [phone]

## 2014-09-08 ENCOUNTER — Encounter (HOSPITAL_COMMUNITY): Payer: Self-pay | Admitting: Emergency Medicine

## 2014-09-08 ENCOUNTER — Emergency Department (HOSPITAL_COMMUNITY)
Admission: EM | Admit: 2014-09-08 | Discharge: 2014-09-08 | Disposition: A | Discharge status: Home / Self Care | Payer: PRIVATE HEALTH INSURANCE | Attending: Emergency Medicine | Admitting: Emergency Medicine

## 2014-09-08 DIAGNOSIS — R197 Diarrhea, unspecified: Secondary | ICD-10-CM | POA: Diagnosis not present

## 2014-09-08 DIAGNOSIS — Z79899 Other long term (current) drug therapy: Secondary | ICD-10-CM | POA: Diagnosis not present

## 2014-09-08 DIAGNOSIS — R112 Nausea with vomiting, unspecified: Secondary | ICD-10-CM | POA: Diagnosis present

## 2014-09-08 DIAGNOSIS — Z720 Tobacco use: Secondary | ICD-10-CM | POA: Diagnosis not present

## 2014-09-08 LAB — CBC WITH DIFFERENTIAL/PLATELET
BASOS ABS: 0 10*3/uL (ref 0.0–0.1)
BASOS PCT: 0 % (ref 0–1)
EOS ABS: 0 10*3/uL (ref 0.0–0.7)
EOS PCT: 0 % (ref 0–5)
HCT: 40.8 % (ref 39.0–52.0)
Hemoglobin: 14.2 g/dL (ref 13.0–17.0)
LYMPHS PCT: 34 % (ref 12–46)
Lymphs Abs: 2.8 10*3/uL (ref 0.7–4.0)
MCH: 30.5 pg (ref 26.0–34.0)
MCHC: 34.8 g/dL (ref 30.0–36.0)
MCV: 87.6 fL (ref 78.0–100.0)
MONO ABS: 0.6 10*3/uL (ref 0.1–1.0)
Monocytes Relative: 7 % (ref 3–12)
Neutro Abs: 4.9 10*3/uL (ref 1.7–7.7)
Neutrophils Relative %: 59 % (ref 43–77)
Platelets: 170 10*3/uL (ref 150–400)
RBC: 4.66 MIL/uL (ref 4.22–5.81)
RDW: 13 % (ref 11.5–15.5)
WBC: 8.4 10*3/uL (ref 4.0–10.5)

## 2014-09-08 LAB — BASIC METABOLIC PANEL
ANION GAP: 16 — AB (ref 5–15)
BUN: 13 mg/dL (ref 6–23)
CALCIUM: 9.1 mg/dL (ref 8.4–10.5)
CO2: 22 meq/L (ref 19–32)
CREATININE: 1.05 mg/dL (ref 0.50–1.35)
Chloride: 97 mEq/L (ref 96–112)
GFR calc Af Amer: >90 mL/min (ref >90)
GFR calc non Af Amer: 89 mL/min — ABNORMAL LOW (ref >90)
Glucose, Bld: 102 mg/dL — ABNORMAL HIGH (ref 70–99)
Potassium: 3.4 mEq/L — ABNORMAL LOW (ref 3.7–5.3)
Sodium: 135 mEq/L — ABNORMAL LOW (ref 137–147)

## 2014-09-08 MED ORDER — SODIUM CHLORIDE 0.9 % IV SOLN
1000.0000 mL | Freq: Once | INTRAVENOUS | Status: AC
  Administered 2014-09-08: 1000 mL via INTRAVENOUS

## 2014-09-08 MED ORDER — SODIUM CHLORIDE 0.9 % IV SOLN
1000.0000 mL | INTRAVENOUS | Status: DC
  Administered 2014-09-08: 1000 mL via INTRAVENOUS

## 2014-09-08 MED ORDER — LOPERAMIDE HCL 2 MG PO CAPS
4.0000 mg | ORAL_CAPSULE | Freq: Once | ORAL | Status: AC
  Administered 2014-09-08: 4 mg via ORAL
  Filled 2014-09-08: qty 2

## 2014-09-08 MED ORDER — ONDANSETRON HCL 4 MG/2ML IJ SOLN
4.0000 mg | Freq: Once | INTRAMUSCULAR | Status: AC
  Administered 2014-09-08: 4 mg via INTRAVENOUS
  Filled 2014-09-08: qty 2

## 2014-09-08 MED ORDER — ONDANSETRON HCL 4 MG PO TABS: 4.0000 mg | ORAL_TABLET | Freq: Four times a day (QID) | ORAL | Status: DC | PRN

## 2014-09-08 NOTE — Discharge Instructions (Signed)
Take loperamide (Imodium AD) as needed for diarrhea. ° °Nausea and Vomiting °Nausea is a sick feeling that often comes before throwing up (vomiting). Vomiting is a reflex where stomach contents come out of your mouth. Vomiting can cause severe loss of body fluids (dehydration). Children and elderly adults can become dehydrated quickly, especially if they also have diarrhea. Nausea and vomiting are symptoms of a condition or disease. It is important to find the cause of your symptoms. °CAUSES  °· Direct irritation of the stomach lining. This irritation can result from increased acid production (gastroesophageal reflux disease), infection, food poisoning, taking certain medicines (such as nonsteroidal anti-inflammatory drugs), alcohol use, or tobacco use. °· Signals from the brain. These signals could be caused by a headache, heat exposure, an inner ear disturbance, increased pressure in the brain from injury, infection, a tumor, or a concussion, pain, emotional stimulus, or metabolic problems. °· An obstruction in the gastrointestinal tract (bowel obstruction). °· Illnesses such as diabetes, hepatitis, gallbladder problems, appendicitis, kidney problems, cancer, sepsis, atypical symptoms of a heart attack, or eating disorders. °· Medical treatments such as chemotherapy and radiation. °· Receiving medicine that makes you sleep (general anesthetic) during surgery. °DIAGNOSIS °Your caregiver may ask for tests to be done if the problems do not improve after a few days. Tests may also be done if symptoms are severe or if the reason for the nausea and vomiting is not clear. Tests may include: °· Urine tests. °· Blood tests. °· Stool tests. °· Cultures (to look for evidence of infection). °· X-rays or other imaging studies. °Test results can help your caregiver make decisions about treatment or the need for additional tests. °TREATMENT °You need to stay well hydrated. Drink frequently but in small amounts. You may wish to  drink water, sports drinks, clear broth, or eat frozen ice pops or gelatin dessert to help stay hydrated. When you eat, eating slowly may help prevent nausea. There are also some antinausea medicines that may help prevent nausea. °HOME CARE INSTRUCTIONS  °· Take all medicine as directed by your caregiver. °· If you do not have an appetite, do not force yourself to eat. However, you must continue to drink fluids. °· If you have an appetite, eat a normal diet unless your caregiver tells you differently. °· Eat a variety of complex carbohydrates (rice, wheat, potatoes, bread), lean meats, yogurt, fruits, and vegetables. °· Avoid high-fat foods because they are more difficult to digest. °· Drink enough water and fluids to keep your urine clear or pale yellow. °· If you are dehydrated, ask your caregiver for specific rehydration instructions. Signs of dehydration may include: °· Severe thirst. °· Dry lips and mouth. °· Dizziness. °· Dark urine. °· Decreasing urine frequency and amount. °· Confusion. °· Rapid breathing or pulse. °SEEK IMMEDIATE MEDICAL CARE IF:  °· You have blood or brown flecks (like coffee grounds) in your vomit. °· You have black or bloody stools. °· You have a severe headache or stiff neck. °· You are confused. °· You have severe abdominal pain. °· You have chest pain or trouble breathing. °· You do not urinate at least once every 8 hours. °· You develop cold or clammy skin. °· You continue to vomit for longer than 24 to 48 hours. °· You have a fever. °MAKE SURE YOU:  °· Understand these instructions. °· Will watch your condition. °· Will get help right away if you are not doing well or get worse. °Document Released: 11/09/2005 Document Revised: 02/01/2012 Document Reviewed: 04/08/2011 °ExitCare® Patient   Information ©2015 [employer]. This information is not intended to replace advice given to you by your health care provider. Make sure you discuss any questions you have with your health care  provider. ° °Diarrhea °Diarrhea is frequent loose and watery bowel movements. It can cause you to feel weak and dehydrated. Dehydration can cause you to become tired and thirsty, have a dry mouth, and have decreased urination that often is dark yellow. Diarrhea is a sign of another problem, most often an infection that will not last long. In most cases, diarrhea typically lasts 2-3 days. However, it can last longer if it is a sign of something more serious. It is important to treat your diarrhea as directed by your caregiver to lessen or prevent future episodes of diarrhea. °CAUSES  °Some common causes include: °· Gastrointestinal infections caused by viruses, bacteria, or parasites. °· Food poisoning or food allergies. °· Certain medicines, such as antibiotics, chemotherapy, and laxatives. °· Artificial sweeteners and fructose. °· Digestive disorders. °HOME CARE INSTRUCTIONS °· Ensure adequate fluid intake (hydration): Have 1 cup (8 oz) of fluid for each diarrhea episode. Avoid fluids that contain simple sugars or sports drinks, fruit juices, whole milk products, and sodas. Your urine should be clear or pale yellow if you are drinking enough fluids. Hydrate with an oral rehydration solution that you can purchase at pharmacies, retail stores, and online. You can prepare an oral rehydration solution at home by mixing the following ingredients together: °¨  - tsp table salt. °¨ ¾ tsp baking soda. °¨  tsp salt substitute containing potassium chloride. °¨ 1  tablespoons sugar. °¨ 1 L (34 oz) of water. °· Certain foods and beverages may increase the speed at which food moves through the gastrointestinal (GI) tract. These foods and beverages should be avoided and include: °¨ Caffeinated and alcoholic beverages. °¨ High-fiber foods, such as raw fruits and vegetables, nuts, seeds, and whole grain breads and cereals. °¨ Foods and beverages sweetened with sugar alcohols, such as xylitol, sorbitol, and mannitol. °· Some foods  may be well tolerated and may help thicken stool including: °¨ Starchy foods, such as rice, toast, pasta, low-sugar cereal, oatmeal, grits, baked potatoes, crackers, and bagels. °¨ Bananas. °¨ Applesauce. °· Add probiotic-rich foods to help increase healthy bacteria in the GI tract, such as yogurt and fermented milk products. °· Wash your hands well after each diarrhea episode. °· Only take over-the-counter or prescription medicines as directed by your caregiver. °· Take a warm bath to relieve any burning or pain from frequent diarrhea episodes. °SEEK IMMEDIATE MEDICAL CARE IF:  °· You are unable to keep fluids down. °· You have persistent vomiting. °· You have blood in your stool, or your stools are black and tarry. °· You do not urinate in 6-8 hours, or there is only a small amount of very dark urine. °· You have abdominal pain that increases or localizes. °· You have weakness, dizziness, confusion, or light-headedness. °· You have a severe headache. °· Your diarrhea gets worse or does not get better. °· You have a fever or persistent symptoms for more than 2-3 days. °· You have a fever and your symptoms suddenly get worse. °MAKE SURE YOU:  °· Understand these instructions. °· Will watch your condition. °· Will get help right away if you are not doing well or get worse. °Document Released: 10/30/2002 Document Revised: 03/26/2014 Document Reviewed: 07/17/2012 °ExitCare® Patient Information ©2015 [employer]. This information is not intended to replace advice given to you   by your health care provider. Make sure you discuss any questions you have with your health care provider. ° °Ondansetron tablets °What is this medicine? °ONDANSETRON (on DAN se tron) is used to treat nausea and vomiting caused by chemotherapy. It is also used to prevent or treat nausea and vomiting after surgery. °This medicine may be used for other purposes; ask your health care provider or pharmacist if you have questions. °COMMON BRAND  NAME(S): Zofran °What should I tell my health care provider before I take this medicine? °They need to know if you have any of these conditions: °-heart disease °-history of irregular heartbeat °-liver disease °-low levels of magnesium or potassium in the blood °-an unusual or allergic reaction to ondansetron, granisetron, other medicines, foods, dyes, or preservatives °-pregnant or trying to get pregnant °-breast-feeding °How should I use this medicine? °Take this medicine by mouth with a glass of water. Follow the directions on your prescription label. Take your doses at regular intervals. Do not take your medicine more often than directed. °Talk to your pediatrician regarding the use of this medicine in children. Special care may be needed. °Overdosage: If you think you have taken too much of this medicine contact a poison control center or emergency room at once. °NOTE: This medicine is only for you. Do not share this medicine with others. °What if I miss a dose? °If you miss a dose, take it as soon as you can. If it is almost time for your next dose, take only that dose. Do not take double or extra doses. °What may interact with this medicine? °Do not take this medicine with any of the following medications: °-apomorphine °-certain medicines for fungal infections like fluconazole, itraconazole, ketoconazole, posaconazole, voriconazole °-cisapride °-dofetilide °-dronedarone °-pimozide °-thioridazine °-ziprasidone °This medicine may also interact with the following medications: °-carbamazepine °-certain medicines for depression, anxiety, or psychotic disturbances °-fentanyl °-linezolid °-MAOIs like Carbex, Eldepryl, Marplan, Nardil, and Parnate °-methylene blue (injected into a vein) °-other medicines that prolong the QT interval (cause an abnormal heart rhythm) °-phenytoin °-rifampicin °-tramadol °This list may not describe all possible interactions. Give your health care provider a list of all the medicines,  herbs, non-prescription drugs, or dietary supplements you use. Also tell them if you smoke, drink alcohol, or use illegal drugs. Some items may interact with your medicine. °What should I watch for while using this medicine? °Check with your doctor or health care professional right away if you have any sign of an allergic reaction. °What side effects may I notice from receiving this medicine? °Side effects that you should report to your doctor or health care professional as soon as possible: °-allergic reactions like skin rash, itching or hives, swelling of the face, lips or tongue °-breathing problems °-confusion °-dizziness °-fast or irregular heartbeat °-feeling faint or lightheaded, falls °-fever and chills °-loss of balance or coordination °-seizures °-sweating °-swelling of the hands or feet °-tightness in the chest °-tremors °-unusually weak or tired °Side effects that usually do not require medical attention (report to your doctor or health care professional if they continue or are bothersome): °-constipation or diarrhea °-headache °This list may not describe all possible side effects. Call your doctor for medical advice about side effects. You may report side effects to FDA at 1-800-FDA-1088. °Where should I keep my medicine? °Keep out of the reach of children. °Store between 2 and 30 degrees C (36 and 86 degrees F). Throw away any unused medicine after the expiration date. °NOTE: This sheet is a   summary. It may not cover all possible information. If you have questions about this medicine, talk to your doctor, pharmacist, or health care provider. °© 2015, [employer]. (2013-08-16 16:27:45) ° °

## 2014-09-08 NOTE — ED Notes (Signed)
Patient c/o nausea, vomiting and diarrhea since midnight.

## 2014-09-08 NOTE — ED Provider Notes (Signed)
CSN: [id number]     Arrival date & time 09/08/14  [id number] History   First MD Initiated Contact with Patient 09/08/14 0630     Chief Complaint  Patient presents with  . Emesis     (Consider location/radiation/quality/duration/timing/severity/associated sxs/prior Treatment) Patient is a 38 y.o. male presenting with vomiting. The history is provided by the patient.  Emesis He had onset about midnight of nausea, vomiting, diarrhea. He has vomited multiple times and had multiple watery bowel movements. He denies fever, chills, sweats. He denies abdominal pain or myalgias. He denies recent sick contacts. He has not taken anything at home for his complaints.  History reviewed. No pertinent past medical history. History reviewed. No pertinent past surgical history. No family history on file. History  Substance Use Topics  . Smoking status: Current Every Day Smoker -- 1.00 packs/day    Types: Cigarettes  . Smokeless tobacco: Not on file  . Alcohol Use: Yes    Review of Systems  Gastrointestinal: Positive for vomiting.  All other systems reviewed and are negative.     Allergies  Diphenhydramine-apap (sleep)  Home Medications   Prior to Admission medications   Medication Sig Start Date End Date Taking? Authorizing Provider  Aspirin-Acetaminophen (GOODYS BODY PAIN PO) Take 2 Packages by mouth 2 (two) times daily as needed (pain).   Yes Historical Provider, MD  HYDROcodone-acetaminophen (NORCO/VICODIN) 5-325 MG per tablet Take 1 tablet by mouth every 4 (four) hours as needed. 08/04/14   Burgess AmorJulie Idol, PA-C   BP 143/76  Pulse 107  Temp(Src) 98.4 F (36.9 C) (Oral)  Resp 18  Ht 5\' 5"  (1.651 m)  Wt 182 lb (82.555 kg)  BMI 30.29 kg/m2  SpO2 96% Physical Exam  Nursing note and vitals reviewed.  38 year old male, resting comfortably and in no acute distress. Vital signs are significant for tachycardia and mild hypertension . Oxygen saturation is 96%, which is normal. Head is  normocephalic and atraumatic. PERRLA, EOMI. Oropharynx is clear. Neck is nontender and supple without adenopathy or JVD. Back is nontender and there is no CVA tenderness. Lungs are clear without rales, wheezes, or rhonchi. Chest is nontender. Heart has regular rate and rhythm without murmur. Abdomen is soft, flat, nontender without masses or hepatosplenomegaly and peristalsis is hypoactive. Extremities have no cyanosis or edema, full range of motion is present. Skin is warm and dry without rash. Neurologic: Mental status is normal, cranial nerves are intact, there are no motor or sensory deficits.  ED Course  Procedures (including critical care time) Labs Review Results for orders placed during the hospital encounter of 09/08/14  CBC WITH DIFFERENTIAL      Result Value Ref Range   WBC 8.4  4.0 - 10.5 K/uL   RBC 4.66  4.22 - 5.81 MIL/uL   Hemoglobin 14.2  13.0 - 17.0 g/dL   HCT 72.040.8  94.739.0 - 09.652.0 %   MCV 87.6  78.0 - 100.0 fL   MCH 30.5  26.0 - 34.0 pg   MCHC 34.8  30.0 - 36.0 g/dL   RDW 28.313.0  66.211.5 - 94.715.5 %   Platelets 170  150 - 400 K/uL   Neutrophils Relative % 59  43 - 77 %   Neutro Abs 4.9  1.7 - 7.7 K/uL   Lymphocytes Relative 34  12 - 46 %   Lymphs Abs 2.8  0.7 - 4.0 K/uL   Monocytes Relative 7  3 - 12 %   Monocytes Absolute 0.6  0.1 -  1.0 K/uL   Eosinophils Relative 0  0 - 5 %   Eosinophils Absolute 0.0  0.0 - 0.7 K/uL   Basophils Relative 0  0 - 1 %   Basophils Absolute 0.0  0.0 - 0.1 K/uL  BASIC METABOLIC PANEL      Result Value Ref Range   Sodium 135 (*) 137 - 147 mEq/L   Potassium 3.4 (*) 3.7 - 5.3 mEq/L   Chloride 97  96 - 112 mEq/L   CO2 22  19 - 32 mEq/L   Glucose, Bld 102 (*) 70 - 99 mg/dL   BUN 13  6 - 23 mg/dL   Creatinine, Ser 4.091.05  0.50 - 1.35 mg/dL   Calcium 9.1  8.4 - 81.110.5 mg/dL   GFR calc non Af Amer 89 (*) >90 mL/min   GFR calc Af Amer >90  >90 mL/min   Anion gap 16 (*) 5 - 15   MDM   Final diagnoses:  Nausea vomiting and diarrhea     Vomiting, diarrhea and pattern most consistent with viral gastroenteritis. He has been given IV hydration will be given ondansetron for nausea and loperamide for diarrhea.  He feels considerably better after above noted treatment. He is discharged with prescription for ondansetron and is advised to use over-the-counter loperamide as needed.  Dione Boozeavid Chenee Munns, MD 09/08/14 [phone]

## 2014-09-19 ENCOUNTER — Emergency Department (HOSPITAL_COMMUNITY)
Admission: EM | Admit: 2014-09-19 | Discharge: 2014-09-19 | Disposition: A | Discharge status: Home / Self Care | Payer: PRIVATE HEALTH INSURANCE | Attending: Emergency Medicine | Admitting: Emergency Medicine

## 2014-09-19 ENCOUNTER — Emergency Department (HOSPITAL_COMMUNITY)
Admission: EM | Admit: 2014-09-19 | Discharge: 2014-09-19 | Discharge status: Against Medical Advice | Payer: PRIVATE HEALTH INSURANCE | Attending: Emergency Medicine | Admitting: Emergency Medicine

## 2014-09-19 ENCOUNTER — Other Ambulatory Visit: Payer: Self-pay

## 2014-09-19 ENCOUNTER — Encounter (HOSPITAL_COMMUNITY): Payer: Self-pay | Admitting: Emergency Medicine

## 2014-09-19 DIAGNOSIS — Z79899 Other long term (current) drug therapy: Secondary | ICD-10-CM | POA: Diagnosis not present

## 2014-09-19 DIAGNOSIS — R197 Diarrhea, unspecified: Secondary | ICD-10-CM | POA: Diagnosis not present

## 2014-09-19 DIAGNOSIS — Z720 Tobacco use: Secondary | ICD-10-CM | POA: Insufficient documentation

## 2014-09-19 DIAGNOSIS — Z5329 Procedure and treatment not carried out because of patient's decision for other reasons: Secondary | ICD-10-CM | POA: Diagnosis present

## 2014-09-19 MED ORDER — LOPERAMIDE HCL 2 MG PO TABS: 2.0000 mg | ORAL_TABLET | Freq: Four times a day (QID) | ORAL | Status: DC | PRN

## 2014-09-19 NOTE — ED Provider Notes (Signed)
CSN: [id number]     Arrival date & time 09/19/14  1632 History   First MD Initiated Contact with Patient 09/19/14 1822     Chief Complaint  Patient presents with  . Diarrhea     (Consider location/radiation/quality/duration/timing/severity/associated sxs/prior Treatment) Patient is a 38 y.o. male presenting with diarrhea. The history is provided by the patient.  Diarrhea Associated symptoms: no abdominal pain, no fever, no headaches, no myalgias and no vomiting    patient with onset of diarrhea loose bowel movements at midnight. Had about 10 episodes of loose bowel movements. No nausea no vomiting no abdominal pain no blood in the bowel movements. No mucus in the bowel movements. No fever.  History reviewed. No pertinent past medical history. History reviewed. No pertinent past surgical history. History reviewed. No pertinent family history. History  Substance Use Topics  . Smoking status: Current Every Day Smoker -- 1.00 packs/day    Types: Cigarettes  . Smokeless tobacco: Not on file  . Alcohol Use: Yes    Review of Systems  Constitutional: Negative for fever.  HENT: Negative for congestion.   Eyes: Negative for redness.  Respiratory: Negative for shortness of breath.   Cardiovascular: Negative for chest pain.  Gastrointestinal: Positive for diarrhea. Negative for nausea, vomiting, abdominal pain and blood in stool.  Genitourinary: Negative for dysuria and hematuria.  Musculoskeletal: Negative for back pain and myalgias.  Skin: Negative for rash.  Neurological: Negative for headaches.  Hematological: Does not bruise/bleed easily.  Psychiatric/Behavioral: Negative for confusion.      Allergies  Diphenhydramine-apap (sleep)  Home Medications   Prior to Admission medications   Medication Sig Start Date End Date Taking? Authorizing Provider  loperamide (IMODIUM A-D) 2 MG tablet Take 1 tablet (2 mg total) by mouth 4 (four) times daily as needed for diarrhea or loose  stools. 09/19/14   Vanetta MuldersScott Ugo Thoma, MD   BP 116/81  Pulse 61  Temp(Src) 97.9 F (36.6 C) (Oral)  Resp 20  Ht 5\' 4"  (1.626 m)  Wt 180 lb (81.647 kg)  BMI 30.88 kg/m2  SpO2 100% Physical Exam  Nursing note and vitals reviewed. Constitutional: He is oriented to person, place, and time. He appears well-developed and well-nourished. No distress.  HENT:  Head: Normocephalic and atraumatic.  Mouth/Throat: Oropharynx is clear and moist.  Eyes: Conjunctivae and EOM are normal. Pupils are equal, round, and reactive to light.  Neck: Normal range of motion.  Cardiovascular: Normal rate and regular rhythm.   No murmur heard. Abdominal: Soft. Bowel sounds are normal. There is no tenderness.  Musculoskeletal: Normal range of motion.  Neurological: He is alert and oriented to person, place, and time. No cranial nerve deficit. He exhibits normal muscle tone. Coordination normal.  Skin: Skin is warm. No rash noted.    ED Course  Procedures (including critical care time) Labs Review Labs Reviewed - No data to display  Imaging Review No results found.   EKG Interpretation None      MDM   Final diagnoses:  Diarrhea    Patient nontoxic no acute distress. Abdomen soft nontender. No vomiting associated. Diarrhea started the last night around midnight had about 10 episodes of loose bowel movements. No blood no mucus. No recent antibiotics. Patient was treated with Imodium ad. No recent travel. No unusual water sources.     Vanetta MuldersScott Ailie Gage, MD 09/19/14 [phone]

## 2014-09-19 NOTE — ED Notes (Addendum)
Patient states diarrhea that started at midnight. A&OX4 patient able to tolerate PO fluids/medications. Patient denies pain and N/V

## 2014-09-19 NOTE — Discharge Instructions (Signed)
That the Imodium right ear as needed. Return for any new or worse symptoms. Work note provided.

## 2014-09-19 NOTE — ED Notes (Signed)
Patient verbalizes understanding of discharge instructions, prescription medications, and home care. Patient ambulatory out of department at this time. 

## 2014-09-19 NOTE — ED Notes (Signed)
Diarrhea since last night , no blood in stool  No NV , no fever or chills

## 2014-09-19 NOTE — ED Notes (Signed)
No answer

## 2015-04-01 ENCOUNTER — Encounter (HOSPITAL_COMMUNITY): Payer: Self-pay | Admitting: Emergency Medicine

## 2015-04-01 ENCOUNTER — Emergency Department (HOSPITAL_COMMUNITY)
Admission: EM | Admit: 2015-04-01 | Discharge: 2015-04-01 | Disposition: A | Discharge status: Home / Self Care | Payer: BLUE CROSS/BLUE SHIELD | Attending: Emergency Medicine | Admitting: Emergency Medicine

## 2015-04-01 DIAGNOSIS — Z720 Tobacco use: Secondary | ICD-10-CM | POA: Diagnosis not present

## 2015-04-01 DIAGNOSIS — R197 Diarrhea, unspecified: Secondary | ICD-10-CM | POA: Diagnosis present

## 2015-04-01 MED ORDER — LOPERAMIDE HCL 2 MG PO CAPS: 2.0000 mg | ORAL_CAPSULE | Freq: Four times a day (QID) | ORAL | Status: DC | PRN

## 2015-04-01 NOTE — ED Notes (Signed)
Pt reports diarrhea for the last 2 days. Denies recent antibiotics. Denies fever, abdominal pain.

## 2015-04-01 NOTE — Discharge Instructions (Signed)
Please call your doctor for a followup appointment within 24-48 hours. When you talk to your doctor please let them know that you were seen in the emergency department and have them acquire all of your records so that they can discuss the findings with you and formulate a treatment plan to fully care for your new and ongoing problems. ° °

## 2015-04-01 NOTE — ED Provider Notes (Signed)
CSN: [id number]     Arrival date & time 04/01/15  [id number] History   First MD Initiated Contact with Patient 04/01/15 1004     Chief Complaint  Patient presents with  . Diarrhea     (Consider location/radiation/quality/duration/timing/severity/associated sxs/prior Treatment) HPI Comments: The patient is a 39 year old male, he has no past medical history, he presents with approximately 24 hours or less of watery diarrhea, multiple episodes, denies any other associated symptoms including fevers chills nausea vomiting or abdominal discomfort. He denies recent travel, new medications, any recent antibiotics or sick contacts, he does state that he has 2 children with upper respiratory infections which are mild, neither of them have diarrhea. The diarrhea has been nonbloody, no medications given prior to arrival  Patient is a 39 y.o. male presenting with diarrhea. The history is provided by the patient.  Diarrhea   History reviewed. No pertinent past medical history. History reviewed. No pertinent past surgical history. History reviewed. No pertinent family history. History  Substance Use Topics  . Smoking status: Current Every Day Smoker -- 1.00 packs/day    Types: Cigarettes  . Smokeless tobacco: Not on file  . Alcohol Use: Yes    Review of Systems  Gastrointestinal: Positive for diarrhea.  All other systems reviewed and are negative.     Allergies  Diphenhydramine-apap (sleep)  Home Medications   Prior to Admission medications   Medication Sig Start Date End Date Taking? Authorizing Provider  loperamide (IMODIUM) 2 MG capsule Take 1 capsule (2 mg total) by mouth 4 (four) times daily as needed for diarrhea or loose stools. 04/01/15   Eber HongBrian Lillyanne Bradburn, MD   BP 126/75 mmHg  Pulse 72  Temp(Src) 98 F (36.7 C) (Oral)  Resp 16  Ht 5\' 5"  (1.651 m)  Wt 175 lb (79.379 kg)  BMI 29.12 kg/m2  SpO2 96% Physical Exam  Constitutional: He appears well-developed and well-nourished. No distress.   HENT:  Head: Normocephalic and atraumatic.  Mouth/Throat: Oropharynx is clear and moist. No oropharyngeal exudate.  Eyes: Conjunctivae and EOM are normal. Pupils are equal, round, and reactive to light. Right eye exhibits no discharge. Left eye exhibits no discharge. No scleral icterus.  Neck: Normal range of motion. Neck supple. No JVD present. No thyromegaly present.  Cardiovascular: Normal rate, regular rhythm, normal heart sounds and intact distal pulses.  Exam reveals no gallop and no friction rub.   No murmur heard. Pulmonary/Chest: Effort normal and breath sounds normal. No respiratory distress. He has no wheezes. He has no rales.  Abdominal: Soft. Bowel sounds are normal. He exhibits no distension and no mass. There is no tenderness.  Musculoskeletal: Normal range of motion. He exhibits no edema or tenderness.  Lymphadenopathy:    He has no cervical adenopathy.  Neurological: He is alert. Coordination normal.  Skin: Skin is warm and dry. No rash noted. No erythema.  Psychiatric: He has a normal mood and affect. His behavior is normal.  Nursing note and vitals reviewed.   ED Course  Procedures (including critical care time) Labs Review Labs Reviewed - No data to display  Imaging Review No results found.    MDM   Final diagnoses:  Diarrhea    Vital signs normal, abdomen nontender, watery diarrhea, likely GI illness, no indication for further imaging or testing, he is adequately staying hydrated with oral intake without any difficulty, recommend Imodium, fluids, return as needed. Patient is in agreement.  Meds given in ED:  Medications - No data to display  New Prescriptions   LOPERAMIDE (IMODIUM) 2 MG CAPSULE    Take 1 capsule (2 mg total) by mouth 4 (four) times daily as needed for diarrhea or loose stools.       Eber HongBrian Edna Rede, MD 04/01/15 1030

## 2015-04-01 NOTE — ED Notes (Signed)
Dr. Miller at the bedside.  
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
## 2015-09-30 IMAGING — CR DG ABDOMEN ACUTE W/ 1V CHEST
3 series · 3 of 3 positions shown · non-contrast
Comparison: Chest/rib radiographs dated 11/11/2013

CLINICAL DATA: Nausea/vomiting

EXAM:
ACUTE ABDOMEN SERIES (ABDOMEN 2 VIEW & CHEST 1 VIEW)

[view not recorded (1 of 3)]
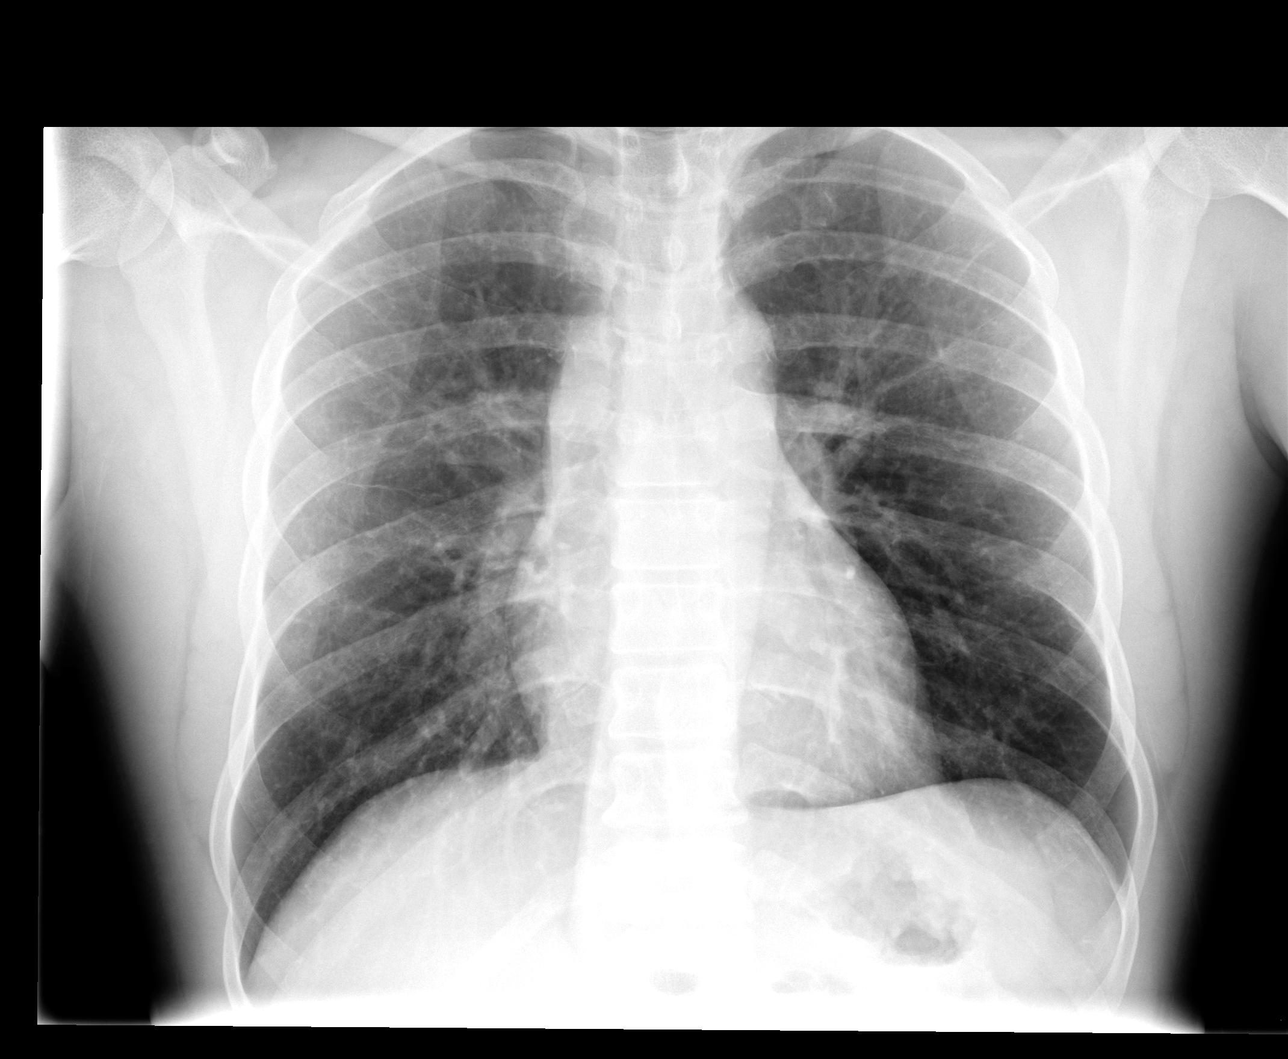

[view not recorded (2 of 3)]
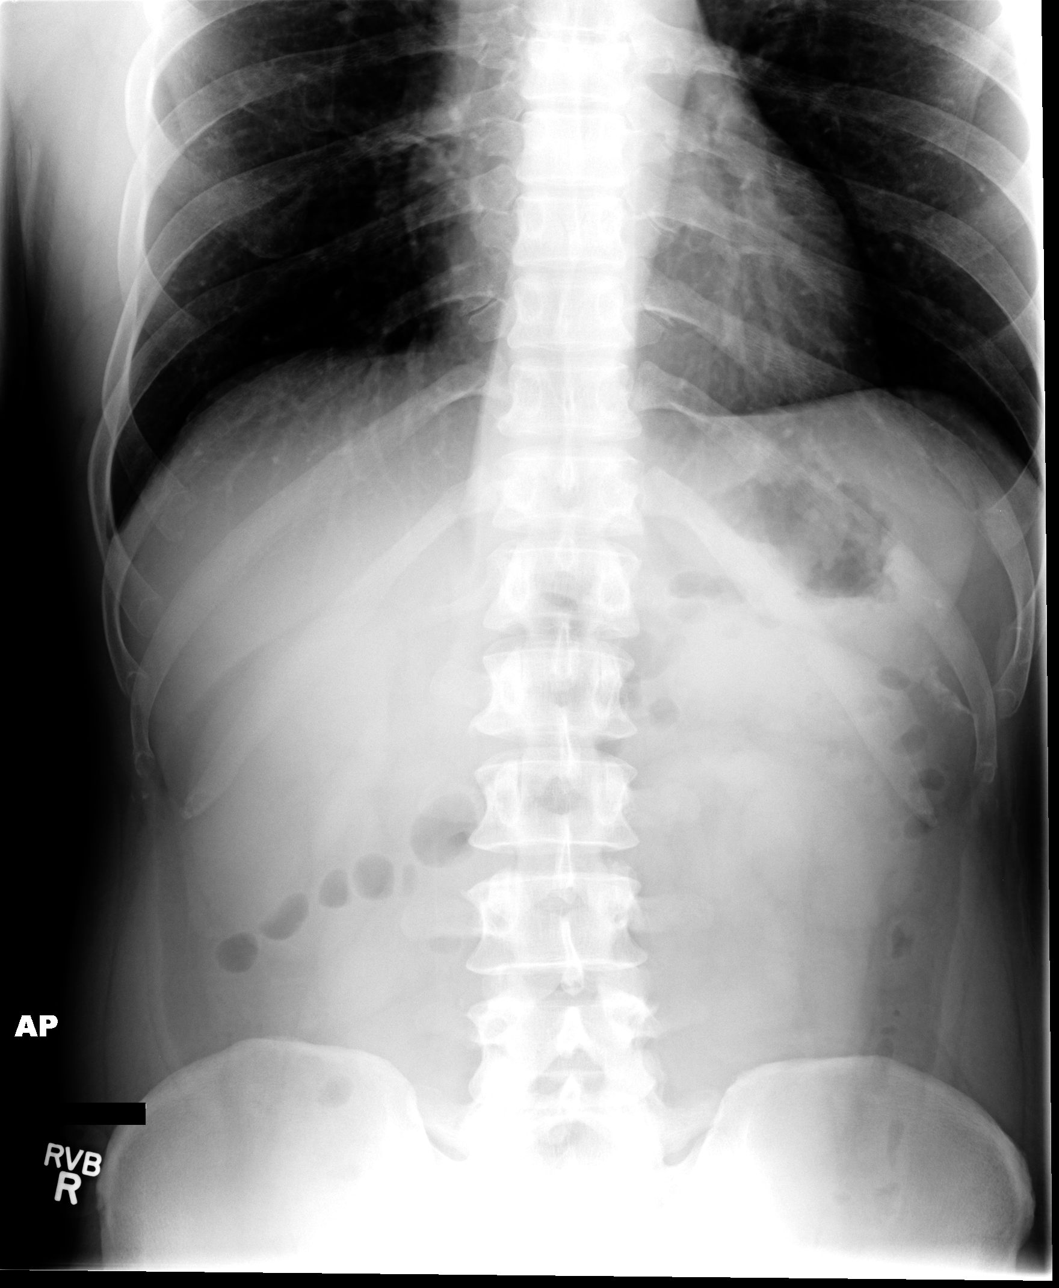

[view not recorded (3 of 3)]
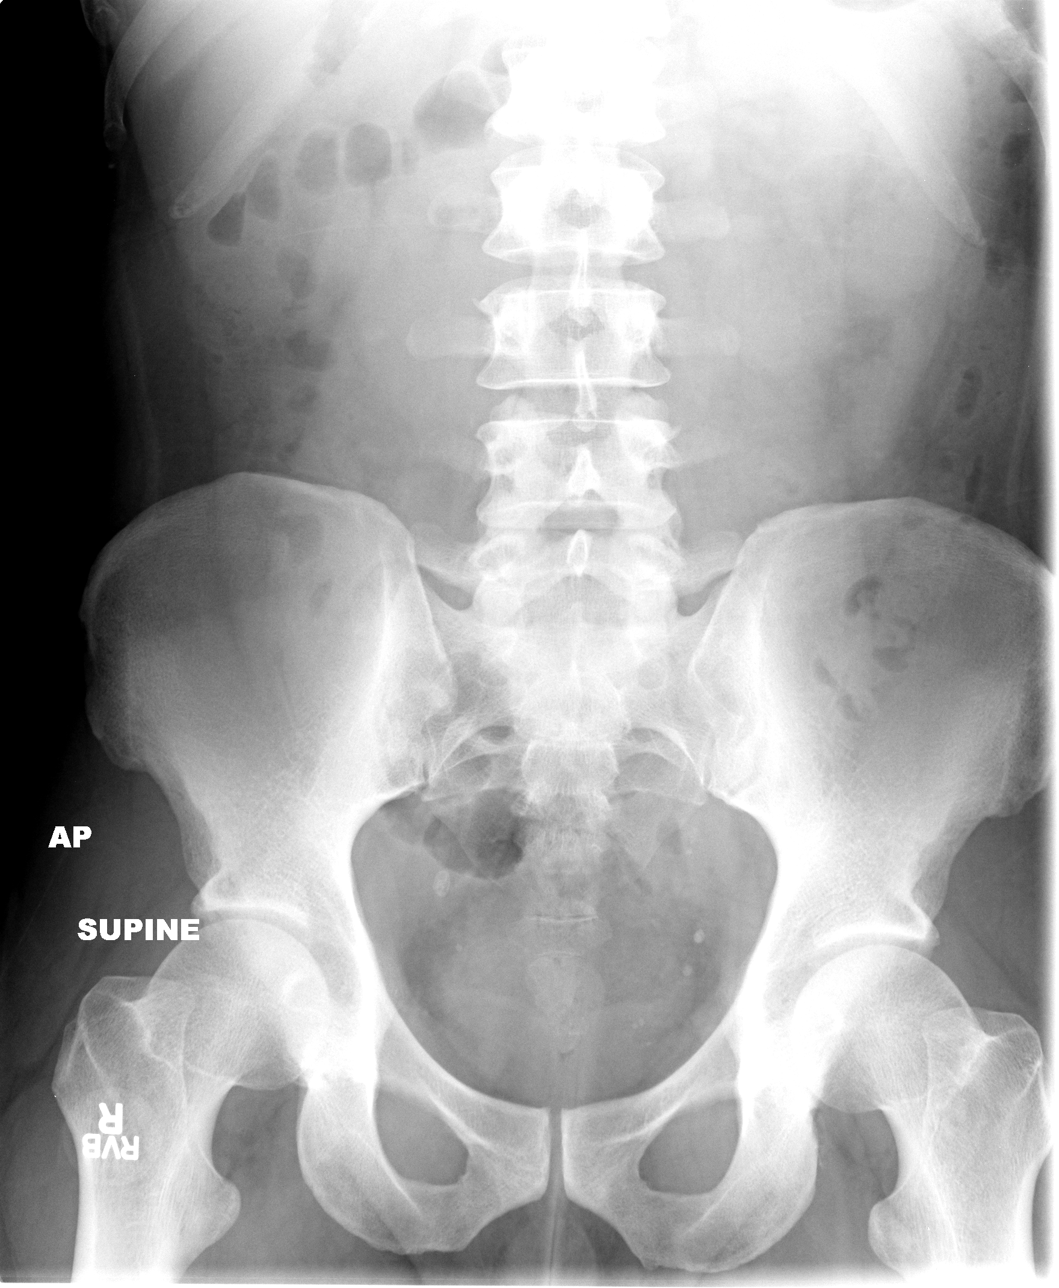

[3 of 3 positions shown; findings below may reference images not displayed]

FINDINGS: Lungs are essentially clear. No focal consolidation. No pleural
effusion or pneumothorax.

The heart is normal in size.

Nonobstructive bowel gas pattern.

No evidence of free air under the diaphragm on the upright view.

Calcifications overlying the bilateral pelvis, statistically
reflecting calcified pelvic phleboliths.

Visualized osseous structures are within normal limits.
IMPRESSION: No evidence of acute cardiopulmonary disease.

No evidence of small bowel obstruction or free air.

## 2017-04-02 ENCOUNTER — Encounter (HOSPITAL_COMMUNITY): Payer: Self-pay

## 2017-04-02 ENCOUNTER — Emergency Department (HOSPITAL_COMMUNITY): Payer: Self-pay

## 2017-04-02 ENCOUNTER — Emergency Department (HOSPITAL_COMMUNITY)
Admission: EM | Admit: 2017-04-02 | Discharge: 2017-04-02 | Disposition: A | Discharge status: Home / Self Care | Payer: Self-pay | Attending: Emergency Medicine | Admitting: Emergency Medicine

## 2017-04-02 DIAGNOSIS — Z23 Encounter for immunization: Secondary | ICD-10-CM | POA: Insufficient documentation

## 2017-04-02 DIAGNOSIS — S51012A Laceration without foreign body of left elbow, initial encounter: Secondary | ICD-10-CM | POA: Insufficient documentation

## 2017-04-02 DIAGNOSIS — Y939 Activity, unspecified: Secondary | ICD-10-CM | POA: Insufficient documentation

## 2017-04-02 DIAGNOSIS — Y929 Unspecified place or not applicable: Secondary | ICD-10-CM | POA: Insufficient documentation

## 2017-04-02 DIAGNOSIS — S0101XA Laceration without foreign body of scalp, initial encounter: Secondary | ICD-10-CM | POA: Insufficient documentation

## 2017-04-02 DIAGNOSIS — T07XXXA Unspecified multiple injuries, initial encounter: Secondary | ICD-10-CM

## 2017-04-02 DIAGNOSIS — Y999 Unspecified external cause status: Secondary | ICD-10-CM | POA: Insufficient documentation

## 2017-04-02 DIAGNOSIS — S20312A Abrasion of left front wall of thorax, initial encounter: Secondary | ICD-10-CM | POA: Insufficient documentation

## 2017-04-02 DIAGNOSIS — S0181XA Laceration without foreign body of other part of head, initial encounter: Secondary | ICD-10-CM | POA: Insufficient documentation

## 2017-04-02 DIAGNOSIS — S41122A Laceration with foreign body of left upper arm, initial encounter: Secondary | ICD-10-CM | POA: Insufficient documentation

## 2017-04-02 LAB — BPAM RBC
BLOOD PRODUCT EXPIRATION DATE: 201805232359
Blood Product Expiration Date: 201805232359
ISSUE DATE / TIME: 201805110334
ISSUE DATE / TIME: 201805110334
UNIT TYPE AND RH: 9500
Unit Type and Rh: 9500

## 2017-04-02 LAB — BPAM FFP
BLOOD PRODUCT EXPIRATION DATE: 201805212359
Blood Product Expiration Date: 201805212359
ISSUE DATE / TIME: 201805110333
ISSUE DATE / TIME: 201805110333
UNIT TYPE AND RH: 600
UNIT TYPE AND RH: 6200

## 2017-04-02 LAB — BLOOD PRODUCT ORDER (VERBAL) VERIFICATION

## 2017-04-02 LAB — CBC WITH DIFFERENTIAL/PLATELET
BASOS ABS: 0 10*3/uL (ref 0.0–0.1)
Basophils Relative: 0 %
Eosinophils Absolute: 0.1 10*3/uL (ref 0.0–0.7)
Eosinophils Relative: 1 %
HEMATOCRIT: 41.3 % (ref 39.0–52.0)
Hemoglobin: 13.8 g/dL (ref 13.0–17.0)
LYMPHS PCT: 62 %
Lymphs Abs: 7.5 10*3/uL — ABNORMAL HIGH (ref 0.7–4.0)
MCH: 29.8 pg (ref 26.0–34.0)
MCHC: 33.4 g/dL (ref 30.0–36.0)
MCV: 89.2 fL (ref 78.0–100.0)
MONOS PCT: 5 %
Monocytes Absolute: 0.6 10*3/uL (ref 0.1–1.0)
Neutro Abs: 3.8 10*3/uL (ref 1.7–7.7)
Neutrophils Relative %: 32 %
Platelets: 181 10*3/uL (ref 150–400)
RBC: 4.63 MIL/uL (ref 4.22–5.81)
RDW: 13 % (ref 11.5–15.5)
WBC: 12 10*3/uL — AB (ref 4.0–10.5)

## 2017-04-02 LAB — I-STAT CHEM 8, ED
BUN: 10 mg/dL (ref 6–20)
CHLORIDE: 105 mmol/L (ref 101–111)
CREATININE: 1.2 mg/dL (ref 0.61–1.24)
Calcium, Ion: 0.91 mmol/L — ABNORMAL LOW (ref 1.15–1.40)
Glucose, Bld: 81 mg/dL (ref 65–99)
HCT: 41 % (ref 39.0–52.0)
HEMOGLOBIN: 13.9 g/dL (ref 13.0–17.0)
POTASSIUM: 3.5 mmol/L (ref 3.5–5.1)
Sodium: 138 mmol/L (ref 135–145)
TCO2: 20 mmol/L (ref 0–100)

## 2017-04-02 LAB — PREPARE FRESH FROZEN PLASMA
Unit division: 0
Unit division: 0

## 2017-04-02 LAB — PATHOLOGIST SMEAR REVIEW: Path Review: REACTIVE

## 2017-04-02 MED ORDER — HYDROMORPHONE HCL 1 MG/ML IJ SOLN
0.5000 mg | Freq: Once | INTRAMUSCULAR | Status: AC
  Administered 2017-04-02: 0.5 mg via INTRAVENOUS
  Filled 2017-04-02: qty 1

## 2017-04-02 MED ORDER — BACITRACIN ZINC 500 UNIT/GM EX OINT: 1.0000 "application " | TOPICAL_OINTMENT | Freq: Two times a day (BID) | CUTANEOUS | 0 refills | Status: DC

## 2017-04-02 MED ORDER — ONDANSETRON HCL 4 MG/2ML IJ SOLN
4.0000 mg | Freq: Once | INTRAMUSCULAR | Status: AC
  Administered 2017-04-02: 4 mg via INTRAVENOUS
  Filled 2017-04-02: qty 2

## 2017-04-02 MED ORDER — TETANUS-DIPHTH-ACELL PERTUSSIS 5-2.5-18.5 LF-MCG/0.5 IM SUSP
0.5000 mL | Freq: Once | INTRAMUSCULAR | Status: AC
  Administered 2017-04-02: 0.5 mL via INTRAMUSCULAR

## 2017-04-02 MED ORDER — HYDROCODONE-ACETAMINOPHEN 5-325 MG PO TABS: 1.0000 | ORAL_TABLET | Freq: Four times a day (QID) | ORAL | 0 refills | Status: DC | PRN

## 2017-04-02 MED ORDER — TETANUS-DIPHTH-ACELL PERTUSSIS 5-2.5-18.5 LF-MCG/0.5 IM SUSP
INTRAMUSCULAR | Status: AC
  Filled 2017-04-02: qty 0.5

## 2017-04-02 MED ORDER — LIDOCAINE HCL (PF) 1 % IJ SOLN
5.0000 mL | Freq: Once | INTRAMUSCULAR | Status: AC
  Administered 2017-04-02: 5 mL via INTRADERMAL
  Filled 2017-04-02: qty 5

## 2017-04-02 MED ORDER — LIDOCAINE-EPINEPHRINE (PF) 2 %-1:200000 IJ SOLN
20.0000 mL | Freq: Once | INTRAMUSCULAR | Status: DC
  Filled 2017-04-02: qty 20

## 2017-04-02 NOTE — ED Provider Notes (Signed)
..  Laceration Repair Date/Time: 04/02/2017 7:27 AM Performed by: Arthor CaptainHARRIS, Kanisha Duba Authorized by: Arthor CaptainHARRIS, Seith Aikey   Consent:    Consent obtained:  Verbal   Consent given by:  Patient   Risks discussed:  Infection, pain, retained foreign body, poor cosmetic result and nerve damage   Alternatives discussed:  No treatment Laceration details:    Location:  Shoulder/arm   Shoulder/arm location:  L upper arm   Length (cm):  12   Depth (mm):  5 Repair type:    Repair type:  Complex Exploration:    Wound extent: fascia violated and muscle damage   Treatment:    Area cleansed with:  Betadine   Amount of cleaning:  Extensive   Irrigation method:  Pressure wash Skin repair:    Repair method:  Sutures   Suture size:  3-0   Suture material:  Prolene   Suture technique:  Running (1 HM, 10 Running)   Number of sutures:  1 Post-procedure details:    Dressing:  Sterile dressing   Patient tolerance of procedure:  Tolerated well, no immediate complications .Marland Kitchen.Laceration Repair Date/Time: 04/02/2017 7:33 AM Performed by: Arthor CaptainHARRIS, Dmani Mizer Authorized by: Arthor CaptainHARRIS, Rmani Kapusta   Laceration details:    Location:  Face   Length (cm):  10   Depth (mm):  3 Repair type:    Repair type:  Simple Treatment:    Irrigation solution:  Sterile saline   Irrigation method:  Pressure wash Skin repair:    Repair method:  Sutures   Suture size:  4-0   Suture material:  Prolene   Number of sutures:  4 Approximation:    Approximation:  Close   Vermilion border: well-aligned   Post-procedure details:    Dressing:  Sterile dressing   Patient tolerance of procedure:  Tolerated well, no immediate complications      Arthor CaptainHarris, Aubrei Bouchie, PA-C 04/02/17 [phone]    Derwood KaplanNanavati, Ankit, MD 04/05/17 1414
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
## 2017-04-02 NOTE — ED Notes (Signed)
Patient denies pain and is resting comfortably.  
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
## 2017-04-02 NOTE — Progress Notes (Signed)
   04/02/17 0530  Clinical Encounter Type  Visited With Patient and family together  Visit Type Follow-up  Spiritual Encounters  Spiritual Needs Emotional  Stress Factors  Patient Stress Factors Health changes  Family Stress Factors Family relationships  Family members arrived. Escorted them to visit Pt.
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
## 2017-04-02 NOTE — ED Notes (Signed)
Spoke to pts mom. She is on way back to hospital with pts belongings. Pt changed into paper scrubs. Cleaned up in sink. Ambulatory in room without dizziness. Appears in nad

## 2017-04-02 NOTE — Progress Notes (Signed)
   04/02/17 0345  Clinical Encounter Type  Visited With Health care provider  Visit Type ED  Spiritual Encounters  Spiritual Needs Emotional  Stress Factors  Patient Stress Factors Not reviewed  Pt alert and yelling. Medical staff doing procedures. Asked to be paged for any follow up.
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
## 2017-04-18 ENCOUNTER — Encounter (HOSPITAL_COMMUNITY): Payer: Self-pay

## 2017-04-18 ENCOUNTER — Emergency Department (HOSPITAL_COMMUNITY)
Admission: EM | Admit: 2017-04-18 | Discharge: 2017-04-18 | Disposition: A | Discharge status: Home / Self Care | Payer: BLUE CROSS/BLUE SHIELD | Attending: Emergency Medicine | Admitting: Emergency Medicine

## 2017-04-18 DIAGNOSIS — Z4802 Encounter for removal of sutures: Secondary | ICD-10-CM | POA: Insufficient documentation

## 2017-04-18 DIAGNOSIS — F17210 Nicotine dependence, cigarettes, uncomplicated: Secondary | ICD-10-CM | POA: Insufficient documentation

## 2017-04-18 DIAGNOSIS — Z79899 Other long term (current) drug therapy: Secondary | ICD-10-CM | POA: Insufficient documentation

## 2017-04-18 NOTE — ED Provider Notes (Signed)
[facility]-EMERGENCY DEPT Provider Note   CSN: [id number] Arrival date & time: 04/18/17  1114  By signing my name below, I, Cristian White, attest that this documentation has been prepared under the direction and in the presence of Demetrios LollKenneth Jasline Buskirk, PA-C. Electronically Signed: Cynda AcresHailei White, Scribe. 04/18/17. 12:38 PM.  History   Chief Complaint Chief Complaint  Patient presents with  . Suture / Staple Removal   HPI Comments: Cristian White is a 41 y.o. male with no pertinent past medical history, who presents to the Emergency Department for staple removal of the head, which was placed 17 days ago. Patient had 8 staples to the posterior head. Patient denies any fever, chills, drainage, numbness, ha, or tingling.   The history is provided by the patient. No language interpreter was used.    History reviewed. No pertinent past medical history.  There are no active problems to display for this patient.   History reviewed. No pertinent surgical history.     Home Medications    Prior to Admission medications   Medication Sig Start Date End Date Taking? Authorizing Provider  loperamide (IMODIUM) 2 MG capsule Take 1 capsule (2 mg total) by mouth 4 (four) times daily as needed for diarrhea or loose stools. 04/01/15   Eber HongMiller, Brian, MD    Family History No family history on file.  Social History Social History  Substance Use Topics  . Smoking status: Current Every Day Smoker    Packs/day: 1.00    Types: Cigarettes  . Smokeless tobacco: Not on file  . Alcohol use Yes     Allergies   Diphenhydramine-apap (sleep)   Review of Systems Review of Systems  Constitutional: Negative for chills and fever.  Gastrointestinal: Negative for nausea and vomiting.  Skin: Positive for wound (posterior head).  Neurological: Negative for weakness and numbness.     Physical Exam Updated Vital Signs BP 128/86   Pulse 74   Temp 98.7 F (37.1 C) (Oral)   Resp 18   SpO2 100%   Physical  Exam  Constitutional: He is oriented to person, place, and time. He appears well-developed.  HENT:  Head: Normocephalic and atraumatic.  Mouth/Throat: Oropharynx is clear and moist.  Well-healed wound to the posterior head. No erythema, drainage, or tenderness.   Eyes: Conjunctivae and EOM are normal. Pupils are equal, round, and reactive to light.  Neck: Normal range of motion. Neck supple.  Cardiovascular: Normal rate.   Pulmonary/Chest: Effort normal.  Abdominal: Soft. Bowel sounds are normal.  Musculoskeletal: Normal range of motion.  Neurological: He is alert and oriented to person, place, and time.  Skin: Skin is warm and dry.     ED Treatments / Results  DIAGNOSTIC STUDIES: Oxygen Saturation is 100% on RA, normal by my interpretation.    COORDINATION OF CARE: 12:37 PM Discussed treatment plan with pt at bedside and pt agreed to plan.  Labs (all labs ordered are listed, but only abnormal results are displayed) Labs Reviewed - No data to display  EKG  EKG Interpretation None       Radiology No results found.  Procedures .Suture Removal Date/Time: 04/18/2017 12:46 PM Performed by: Demetrios LollLEAPHART, Elowyn Raupp T Authorized by: Demetrios LollLEAPHART, Hao Dion T   Consent:    Consent obtained:  Verbal   Consent given by:  Patient   Risks discussed:  Bleeding, pain and wound separation   Alternatives discussed:  No treatment Location:    Location:  Head/neck   Head/neck location:  Scalp Procedure details:  Wound appearance:  No signs of infection and nonpurulent   Number of staples removed:  8 Post-procedure details:    Post-removal:  No dressing applied   Patient tolerance of procedure:  Tolerated well, no immediate complications   (including critical care time)  Medications Ordered in ED Medications - No data to display   Initial Impression / Assessment and Plan / ED Course  I have reviewed the triage vital signs and the nursing notes.  Pertinent labs & imaging results  that were available during my care of the patient were reviewed by me and considered in my medical decision making (see chart for details).     Staple removal   Pt to ER for staple/suture removal and wound check as above. Procedure tolerated well. Vitals normal, no signs of infection. Scar minimization & return precautions given at dc.    Final Clinical Impressions(s) / ED Diagnoses   Final diagnoses:  Visit for suture removal    New Prescriptions New Prescriptions   No medications on file   I personally performed the services described in this documentation, which was scribed in my presence. The recorded information has been reviewed and is accurate.     Rise Mu, PA-C 04/18/17 1247    Tilden Fossa, MD 04/18/17 [phone]

## 2017-04-18 NOTE — Discharge Instructions (Signed)
If you develops signs of infection including drainage from the site worsening redness, fevers, chills, worsening pain return to the ER. Continue normal activity as indicated.

## 2017-04-18 NOTE — ED Triage Notes (Signed)
Pt presents to the ed to get his staples removed out of his head that were placed on 5/10. Denies any complaints

## 2017-04-18 NOTE — ED Notes (Signed)
Pt is in stable condition upon d/c and ambulates from ED. 

## 2017-04-28 ENCOUNTER — Encounter (HOSPITAL_COMMUNITY): Payer: Self-pay

## 2018-09-08 ENCOUNTER — Emergency Department (HOSPITAL_COMMUNITY)
Admission: EM | Admit: 2018-09-08 | Discharge: 2018-09-08 | Disposition: A | Discharge status: Home / Self Care | Payer: BLUE CROSS/BLUE SHIELD | Attending: Emergency Medicine | Admitting: Emergency Medicine

## 2018-09-08 ENCOUNTER — Other Ambulatory Visit: Payer: Self-pay

## 2018-09-08 DIAGNOSIS — Y939 Activity, unspecified: Secondary | ICD-10-CM | POA: Insufficient documentation

## 2018-09-08 DIAGNOSIS — S71111A Laceration without foreign body, right thigh, initial encounter: Secondary | ICD-10-CM | POA: Insufficient documentation

## 2018-09-08 DIAGNOSIS — Y999 Unspecified external cause status: Secondary | ICD-10-CM | POA: Insufficient documentation

## 2018-09-08 DIAGNOSIS — F1490 Cocaine use, unspecified, uncomplicated: Secondary | ICD-10-CM | POA: Insufficient documentation

## 2018-09-08 DIAGNOSIS — W293XXA Contact with powered garden and outdoor hand tools and machinery, initial encounter: Secondary | ICD-10-CM | POA: Insufficient documentation

## 2018-09-08 DIAGNOSIS — S81811A Laceration without foreign body, right lower leg, initial encounter: Secondary | ICD-10-CM

## 2018-09-08 DIAGNOSIS — Y929 Unspecified place or not applicable: Secondary | ICD-10-CM | POA: Insufficient documentation

## 2018-09-08 DIAGNOSIS — F17210 Nicotine dependence, cigarettes, uncomplicated: Secondary | ICD-10-CM | POA: Insufficient documentation

## 2018-09-08 MED ORDER — LIDOCAINE-EPINEPHRINE (PF) 2 %-1:200000 IJ SOLN
20.0000 mL | Freq: Once | INTRAMUSCULAR | Status: AC
  Administered 2018-09-08: 20 mL via INTRADERMAL
  Filled 2018-09-08: qty 20

## 2018-09-08 NOTE — ED Provider Notes (Signed)
[facility] Note   CSN: [id number] Arrival date & time: 09/08/18  1338     History   Chief Complaint Chief Complaint  Patient presents with  . Laceration  . Leg Pain    HPI Cristian White is a 42 y.o. male.  HPI  Pt is a 42 y/o male with no significant PMHx who presents to the ED today c/o right leg laceration that occurred PTA after he cut his leg with a hedge trimmer. He now has a laceration to the right leg. No numbness or weakness. Normal ROM.  States this occurred suddenly. Pain is mild. No exacerbating or alleviating factors. Tetanus UTD.  No past medical history on file.  There are no active problems to display for this patient.   No past surgical history on file.      Home Medications    Prior to Admission medications   Medication Sig Start Date End Date Taking? Authorizing Provider  bacitracin ointment Apply 1 application topically 2 (two) times daily. 04/02/17   Derwood Kaplan, MD  HYDROcodone-acetaminophen (NORCO) 5-325 MG tablet Take 1-2 tablets by mouth every 6 (six) hours as needed for moderate pain. 04/02/17   Arthor Captain, PA-C  loperamide (IMODIUM) 2 MG capsule Take 1 capsule (2 mg total) by mouth 4 (four) times daily as needed for diarrhea or loose stools. 04/01/15   Eber Hong, MD    Family History No family history on file.  Social History Social History   Tobacco Use  . Smoking status: Current Every Day Smoker    Packs/day: 1.00    Types: Cigarettes  Substance Use Topics  . Alcohol use: Yes  . Drug use: Yes    Types: Cocaine    Comment: Last used cocaine xfew dys ago     Allergies   Diphenhydramine-apap (sleep)   Review of Systems Review of Systems  Constitutional: Negative for fever.  Skin: Positive for wound.  Neurological: Negative for weakness and numbness.     Physical Exam Updated Vital Signs BP 130/88 (BP Location: Right Arm)   Pulse 73   Temp 98.5 F (36.9 C)  (Oral)   Resp 16   Ht 5\' 5"  (1.651 m)   Wt 79.4 kg   SpO2 100%   BMI 29.12 kg/m   Physical Exam  Constitutional: He is oriented to person, place, and time. He appears well-developed and well-nourished. No distress.  Eyes: Conjunctivae are normal.  Cardiovascular: Normal rate and regular rhythm.  Pulmonary/Chest: Effort normal and breath sounds normal.  Neurological: He is alert and oriented to person, place, and time.  Skin: Skin is warm and dry.  3cm jagged laceration to left distal/medial thigh. No active bleeding. No fb noted. No muscle or bony involvement. Normal ROM. NVI.     ED Treatments / Results  Labs (all labs ordered are listed, but only abnormal results are displayed) Labs Reviewed - No data to display  EKG None  Radiology No results found.  Procedures .Marland KitchenLaceration Repair Date/Time: 09/08/2018 4:00 PM Performed by: Karrie Meres, PA-C Authorized by: Karrie Meres, PA-C   Consent:    Consent obtained:  Verbal   Consent given by:  Patient   Risks discussed:  Infection, pain and poor cosmetic result Anesthesia (see MAR for exact dosages):    Anesthesia method:  Local infiltration   Local anesthetic:  Lidocaine 2% WITH epi Laceration details:    Location:  Leg   Leg location:  R upper leg  Length (cm):  3 Repair type:    Repair type:  Intermediate Pre-procedure details:    Preparation:  Patient was prepped and draped in usual sterile fashion Exploration:    Hemostasis achieved with:  Epinephrine   Wound exploration: wound explored through full range of motion and entire depth of wound probed and visualized     Contaminated: no   Treatment:    Area cleansed with:  Shur-Clens and saline   Amount of cleaning:  Extensive   Irrigation solution:  Sterile saline   Irrigation volume:  1L   Irrigation method:  Pressure wash   Visualized foreign bodies/material removed: no   Skin repair:    Repair method:  Sutures   Suture size:  5-0   Suture  technique:  Simple interrupted   Number of sutures:  9 Approximation:    Approximation:  Close Post-procedure details:    Dressing:  Adhesive bandage   Patient tolerance of procedure:  Tolerated well, no immediate complications   (including critical care time)  Medications Ordered in ED Medications  lidocaine-EPINEPHrine (XYLOCAINE W/EPI) 2 %-1:200000 (PF) injection 20 mL (20 mLs Intradermal Given 09/08/18 1528)     Initial Impression / Assessment and Plan / ED Course  I have reviewed the triage vital signs and the nursing notes.  Pertinent labs & imaging results that were available during my care of the patient were reviewed by me and considered in my medical decision making (see chart for details).    Final Clinical Impressions(s) / ED Diagnoses   Final diagnoses:  Laceration of right lower extremity, initial encounter   Pressure irrigation performed. Wound explored and base of wound visualized in a bloodless field without evidence of foreign body.  Laceration occurred < 8 hours prior to repair which was well tolerated. Tdap UTD. Pt has no comorbidities to effect normal wound healing. Pt discharged without antibiotics.  Discussed suture home care with patient and answered questions. Pt to follow-up for wound check and suture removal in 10 days; they are to return to the ED sooner for signs of infection. Pt is hemodynamically stable with no complaints prior to dc.    ED Discharge Orders    None       Karrie Meres, [place] 09/08/18 1602    Mancel Bale, MD 09/08/18 1719

## 2018-09-08 NOTE — Discharge Instructions (Signed)
Please follow-up for suture removal at either urgent care ,the emergency department, or your primary care doctor in 10 days. ° °Please return to the emergency room immediately if you experience any new or worsening symptoms or any symptoms that indicate worsening infection such as fevers, increased redness/swelling/pain, warmth, or drainage from the affected area.  ° °

## 2018-09-08 NOTE — ED Triage Notes (Signed)
Patient arrives with c/o right leg laceration. Patient was using hedge trimmers at work and cut himself. Laceration noted to thigh, bleeding controlled. Tetanus updated within last 5 years.

## 2021-01-22 ENCOUNTER — Emergency Department (HOSPITAL_COMMUNITY): Payer: 59

## 2021-01-22 ENCOUNTER — Encounter (HOSPITAL_COMMUNITY): Payer: Self-pay | Admitting: Emergency Medicine

## 2021-01-22 ENCOUNTER — Observation Stay (HOSPITAL_COMMUNITY)
Admission: EM | Admit: 2021-01-22 | Discharge: 2021-01-23 | Disposition: A | Discharge status: Home / Self Care | Payer: 59 | Attending: Internal Medicine | Admitting: Internal Medicine

## 2021-01-22 ENCOUNTER — Other Ambulatory Visit: Payer: Self-pay

## 2021-01-22 DIAGNOSIS — Z20822 Contact with and (suspected) exposure to covid-19: Secondary | ICD-10-CM | POA: Diagnosis not present

## 2021-01-22 DIAGNOSIS — R55 Syncope and collapse: Secondary | ICD-10-CM | POA: Diagnosis not present

## 2021-01-22 DIAGNOSIS — F17210 Nicotine dependence, cigarettes, uncomplicated: Secondary | ICD-10-CM | POA: Diagnosis not present

## 2021-01-22 LAB — RESP PANEL BY RT-PCR (FLU A&B, COVID) ARPGX2
Influenza A by PCR: NEGATIVE
Influenza B by PCR: NEGATIVE
SARS Coronavirus 2 by RT PCR: NEGATIVE

## 2021-01-22 LAB — RAPID URINE DRUG SCREEN, HOSP PERFORMED
Amphetamines: NOT DETECTED
Barbiturates: NOT DETECTED
Benzodiazepines: NOT DETECTED
Cocaine: POSITIVE — AB
Opiates: NOT DETECTED
Tetrahydrocannabinol: NOT DETECTED

## 2021-01-22 LAB — URINALYSIS, ROUTINE W REFLEX MICROSCOPIC
Bilirubin Urine: NEGATIVE
Glucose, UA: NEGATIVE mg/dL
Hgb urine dipstick: NEGATIVE
Ketones, ur: NEGATIVE mg/dL
Leukocytes,Ua: NEGATIVE
Nitrite: NEGATIVE
Protein, ur: NEGATIVE mg/dL
Specific Gravity, Urine: 1.011 (ref 1.005–1.030)
pH: 5 (ref 5.0–8.0)

## 2021-01-22 LAB — CBC
HCT: 46.9 % (ref 39.0–52.0)
Hemoglobin: 15.5 g/dL (ref 13.0–17.0)
MCH: 30.2 pg (ref 26.0–34.0)
MCHC: 33 g/dL (ref 30.0–36.0)
MCV: 91.2 fL (ref 80.0–100.0)
Platelets: 191 10*3/uL (ref 150–400)
RBC: 5.14 MIL/uL (ref 4.22–5.81)
RDW: 12.4 % (ref 11.5–15.5)
WBC: 10.5 10*3/uL (ref 4.0–10.5)
nRBC: 0 % (ref 0.0–0.2)

## 2021-01-22 LAB — LIPASE, BLOOD: Lipase: 25 U/L (ref 11–51)

## 2021-01-22 LAB — HEPATIC FUNCTION PANEL
ALT: 34 U/L (ref 0–44)
AST: 26 U/L (ref 15–41)
Albumin: 4.5 g/dL (ref 3.5–5.0)
Alkaline Phosphatase: 73 U/L (ref 38–126)
Bilirubin, Direct: 0.1 mg/dL (ref 0.0–0.2)
Indirect Bilirubin: 0.9 mg/dL (ref 0.3–0.9)
Total Bilirubin: 1 mg/dL (ref 0.3–1.2)
Total Protein: 7.2 g/dL (ref 6.5–8.1)

## 2021-01-22 LAB — ETHANOL: Alcohol, Ethyl (B): <10 mg/dL (ref <10)

## 2021-01-22 LAB — BASIC METABOLIC PANEL
Anion gap: 11 (ref 5–15)
BUN: 11 mg/dL (ref 6–20)
CO2: 24 mmol/L (ref 22–32)
Calcium: 9.6 mg/dL (ref 8.9–10.3)
Chloride: 103 mmol/L (ref 98–111)
Creatinine, Ser: 0.83 mg/dL (ref 0.61–1.24)
GFR, Estimated: >60 mL/min (ref >60)
Glucose, Bld: 161 mg/dL — ABNORMAL HIGH (ref 70–99)
Potassium: 3.9 mmol/L (ref 3.5–5.1)
Sodium: 138 mmol/L (ref 135–145)

## 2021-01-22 LAB — TROPONIN I (HIGH SENSITIVITY): Troponin I (High Sensitivity): <2 ng/L (ref <18)

## 2021-01-22 MED ORDER — ONDANSETRON HCL 4 MG/2ML IJ SOLN: 4.0000 mg | Freq: Four times a day (QID) | INTRAMUSCULAR | Status: DC | PRN

## 2021-01-22 MED ORDER — ACETAMINOPHEN 325 MG PO TABS
650.0000 mg | ORAL_TABLET | Freq: Four times a day (QID) | ORAL | Status: DC | PRN
  Administered 2021-01-23: 650 mg via ORAL
  Filled 2021-01-22: qty 2

## 2021-01-22 MED ORDER — ONDANSETRON HCL 4 MG PO TABS: 4.0000 mg | ORAL_TABLET | Freq: Four times a day (QID) | ORAL | Status: DC | PRN

## 2021-01-22 MED ORDER — SODIUM CHLORIDE 0.9% FLUSH
3.0000 mL | Freq: Two times a day (BID) | INTRAVENOUS | Status: DC
  Administered 2021-01-22: 3 mL via INTRAVENOUS

## 2021-01-22 MED ORDER — ENOXAPARIN SODIUM 60 MG/0.6ML ~~LOC~~ SOLN
45.0000 mg | SUBCUTANEOUS | Status: DC
  Administered 2021-01-22: 45 mg via SUBCUTANEOUS
  Filled 2021-01-22: qty 0.45

## 2021-01-22 MED ORDER — ACETAMINOPHEN 650 MG RE SUPP: 650.0000 mg | Freq: Four times a day (QID) | RECTAL | Status: DC | PRN

## 2021-01-22 NOTE — ED Notes (Signed)
Physician at pt bedside  

## 2021-01-22 NOTE — ED Notes (Signed)
Pt transported to radiology.

## 2021-01-22 NOTE — ED Triage Notes (Signed)
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

## 2021-01-22 NOTE — ED Notes (Signed)
pt states unaware he needed to provide urine sample

## 2021-01-22 NOTE — ED Notes (Addendum)
Pt ambulated to restroom without assistance.

## 2021-01-22 NOTE — H&P (Signed)
History and Physical    Cristian White VEH:[id number] DOB: Apr 14, 1976 DOA: 01/22/2021  PCP: Patient, No Pcp Per  Patient coming from: Home  I have personally briefly reviewed patient's old medical records in [facility]  Chief Complaint: Syncope  HPI: Cristian White is a 45 y.o. male with no significant PMH.  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  He states he started having SOB, feeling like he was choking and unable to breath, then awakened on floor by coworkers, presumably fell out of forklift.  Had similar episode 1 month ago.  Having these episodes about once a month ongoing for past few years.  Will also at times wake up at night feeling like he is unable to breathe.  Usually able to get breathing under control, but had syncope last episode as well.  Does have occasional cocaine use, last was 3 days ago, drinks alcohol 3 times a week, no h/o IVDU.  No CP, no palpitations during episodes.   ED Course: UDS positive for cocaine.  Trop neg.  Other labs unremarkable.   Review of Systems: As per HPI, otherwise all review of systems negative.  History reviewed. No pertinent past medical history.  History reviewed. No pertinent surgical history.   reports that he has been smoking cigarettes. He has been smoking about 1.00 pack per day. He does not have any smokeless tobacco history on file. He reports current alcohol use. He reports current drug use. Drug: Cocaine.  Allergies  Allergen Reactions  . Diphenhydramine-Apap (Sleep)     Fainting, passes out    Family History  Problem Relation Age of Onset  . Sudden Cardiac Death Neg Hx      Prior to Admission medications   Medication Sig Start Date End Date Taking? Authorizing Provider  acetaminophen (TYLENOL) 500 MG tablet Take 500 mg by mouth every 6 (six) hours as needed for moderate pain.   Yes [provider]  bacitracin  ointment Apply 1 application topically 2 (two) times daily. Patient not taking: Reported on 01/22/2021 04/02/17   Derwood Kaplan, MD  HYDROcodone-acetaminophen (NORCO) 5-325 MG tablet Take 1-2 tablets by mouth every 6 (six) hours as needed for moderate pain. Patient not taking: Reported on 01/22/2021 04/02/17   Arthor Captain, PA-C  loperamide (IMODIUM) 2 MG capsule Take 1 capsule (2 mg total) by mouth 4 (four) times daily as needed for diarrhea or loose stools. Patient not taking: Reported on 01/22/2021 04/01/15   Eber Hong, MD    Physical Exam: Vitals:   01/22/21 1733 01/22/21 1915 01/22/21 2000 01/22/21 2100  BP: (!) 134/94 134/88 123/80 119/79  Pulse: 80 74 63 66  Resp: 18 19 17 20   Temp: 98.5 F (36.9 C)     TempSrc: Oral     SpO2: 98% 99% 96% 96%  Weight:  93 kg    Height:  5\' 5"  (1.651 m)      Constitutional: NAD, calm, comfortable Eyes: PERRL, lids and conjunctivae normal ENMT: Mucous membranes are moist. Posterior pharynx clear of any exudate or lesions.Normal dentition.  Neck: normal, supple, no masses, no thyromegaly Respiratory: clear to auscultation bilaterally, no wheezing, no crackles. Normal respiratory effort. No accessory muscle use.  Cardiovascular: Regular rate and rhythm, no murmurs / rubs / gallops. No extremity edema. 2+ pedal pulses. No carotid bruits.  Abdomen: no tenderness, no masses palpated. No hepatosplenomegaly. Bowel sounds positive.  Musculoskeletal: no clubbing /  cyanosis. No joint deformity upper and lower extremities. Good ROM, no contractures. Normal muscle tone.  Skin: no rashes, lesions, ulcers. No induration Neurologic: CN 2-12 grossly intact. Sensation intact, DTR normal. Strength 5/5 in all 4.  Psychiatric: Normal judgment and insight. Alert and oriented x 3. Normal mood.    Labs on Admission: I have personally reviewed following labs and imaging studies  CBC: Recent Labs  Lab 01/22/21 1748  WBC 10.5  HGB 15.5  HCT 46.9  MCV 91.2  PLT  191   Basic Metabolic Panel: Recent Labs  Lab 01/22/21 1748  NA 138  K 3.9  CL 103  CO2 24  GLUCOSE 161*  BUN 11  CREATININE 0.83  CALCIUM 9.6   GFR: Estimated Creatinine Clearance: 120.3 mL/min (by C-G formula based on SCr of 0.83 mg/dL). Liver Function Tests: Recent Labs  Lab 01/22/21 1922  AST 26  ALT 34  ALKPHOS 73  BILITOT 1.0  PROT 7.2  ALBUMIN 4.5   Recent Labs  Lab 01/22/21 1922  LIPASE 25   No results for input(s): AMMONIA in the last 168 hours. Coagulation Profile: No results for input(s): INR, PROTIME in the last 168 hours. Cardiac Enzymes: No results for input(s): CKTOTAL, CKMB, CKMBINDEX, TROPONINI in the last 168 hours. BNP (last 3 results) No results for input(s): PROBNP in the last 8760 hours. HbA1C: No results for input(s): HGBA1C in the last 72 hours. CBG: No results for input(s): GLUCAP in the last 168 hours. Lipid Profile: No results for input(s): CHOL, HDL, LDLCALC, TRIG, CHOLHDL, LDLDIRECT in the last 72 hours. Thyroid Function Tests: No results for input(s): TSH, T4TOTAL, FREET4, T3FREE, THYROIDAB in the last 72 hours. Anemia Panel: No results for input(s): VITAMINB12, FOLATE, FERRITIN, TIBC, IRON, RETICCTPCT in the last 72 hours. Urine analysis:    Component Value Date/Time   COLORURINE YELLOW 01/22/2021 2030   APPEARANCEUR CLEAR 01/22/2021 2030   LABSPEC 1.011 01/22/2021 2030   PHURINE 5.0 01/22/2021 2030   GLUCOSEU NEGATIVE 01/22/2021 2030   HGBUR NEGATIVE 01/22/2021 2030   BILIRUBINUR NEGATIVE 01/22/2021 2030   KETONESUR NEGATIVE 01/22/2021 2030   PROTEINUR NEGATIVE 01/22/2021 2030   UROBILINOGEN 0.2 11/11/2013 1615   NITRITE NEGATIVE 01/22/2021 2030   LEUKOCYTESUR NEGATIVE 01/22/2021 2030    Radiological Exams on Admission: DG Chest 2 View  Result Date: 01/22/2021 CLINICAL DATA:  Syncope. "passed out" and fell hitting head-does not know how long he was out. EXAM: X-ray ribs 11/11/2013 FINDINGS: The heart size and  mediastinal contours are within normal limits. No focal consolidation. No pulmonary edema. No pleural effusion. No pneumothorax. No acute osseous abnormality. Possible slight wedge compression deformity of a midthoracic vertebral body. IMPRESSION: 1. No active cardiopulmonary disease. 2. Possible slight wedge compression deformity of a midthoracic vertebral body. Correlate with midline point tenderness to palpation to evaluate for an acute component. Electronically Signed   By: Tish Frederickson M.D.   On: 01/22/2021 19:47   CT Head Wo Contrast  Result Date: 01/22/2021 CLINICAL DATA:  Un witnessed fall, hit head EXAM: CT HEAD WITHOUT CONTRAST TECHNIQUE: Contiguous axial images were obtained from the base of the skull through the vertex without intravenous contrast. COMPARISON:  04/02/2017 FINDINGS: Brain: No acute infarct or hemorrhage. Lateral ventricles and midline structures are unremarkable. No acute extra-axial fluid collections. No mass effect. Vascular: No hyperdense vessel or unexpected calcification. Skull: Normal. Negative for fracture or focal lesion. Sinuses/Orbits: Mild mucosal thickening left maxillary sinus. Remaining paranasal sinuses are clear. Other: None. IMPRESSION: 1.  No acute intracranial process. 2. Minimal left maxillary sinus disease. Electronically Signed   By: Sharlet Salina M.D.   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 Neck trauma, midline tenderness. EXAM: CT CERVICAL SPINE WITHOUT CONTRAST TECHNIQUE: Multidetector CT imaging of the cervical spine was performed without intravenous contrast. Multiplanar CT image reconstructions were also generated. COMPARISON:  X-ray cervical spine 05/30/2008. FINDINGS: Alignment: Normal. Skull base and vertebrae: Multilevel mild-to-moderate degenerative changes of the spine. No acute fracture. No aggressive appearing focal osseous  lesion or focal pathologic process. Soft tissues and spinal canal: No prevertebral fluid or swelling. No visible canal hematoma. Upper chest: Unremarkable. Other: Mild atherosclerotic plaque of the carotid arteries within the neck. IMPRESSION: No acute displaced fracture or traumatic listhesis of the cervical spine. Electronically Signed   By: Tish Frederickson M.D.   On: 01/22/2021 19:14    EKG: Independently reviewed.  ? [facility].  Assessment/Plan Active Problems:   Syncope    1. Syncope - 1. Syncope pathway 2. Tele monitor 3. 2d echo  DVT prophylaxis: Lovenox Code Status: Full Family Communication: No family in room Disposition Plan: Home after syncope workup Consults called: None Admission status: Place in 50    Bettie Swavely M. DO [facility]  How to contact the [facility] Attending or Consulting provider 7A - 7P or covering provider during after hours 7P -7A, for this patient?  1. Check the care team in [facility] and look for a) attending/consulting [facility] provider listed and b) the [facility] team listed 2. Log into [email or web address]  Am[employer] Physician Scheduling and messaging for groups and whole hospitals  On call and physician scheduling software for group practices, residents, hospitalists and other medical providers for call, clinic, rotation and shift schedules. On[employer] is a hospital-wide system for scheduling doctors and paging doctors on call. EasyPlot is for scientific plotting and data analysis.  [email or web address]  and use [employer] universal password to access. If you do not have the password, please contact the hospital operator.  3. Locate the [facility] provider you are looking for under [facility] and page to a number that you can be directly reached. 4. If you still have difficulty reaching the provider, please page the [facility] (Director on Call) for the Hospitalists listed on amion for assistance.  01/22/2021, 10:02 PM

## 2021-01-22 NOTE — ED Provider Notes (Signed)
[facility]-EMERGENCY DEPT Provider Note   CSN: [id number] Arrival date & time: 01/22/21  1724     History Chief Complaint  Patient presents with  . Fall  . Loss of Consciousness    Cristian White is a 45 y.o. male with no past medical history who presents today for evaluation after syncopal event.  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  He states that he started feeling like he was choking and unable to breathe and then was awakened on the floor by his coworkers after he presumably fell out of the forklift.  Unclear how long he was unconscious for however he does report pain in his head and neck.  He states that he had a similar episode about a month ago.  He has been having these episodes about once a month ongoing for the past few years where he will wake up in the middle of the night feeling like he is unable to breathe.  He says that normally he is able to get his breathing better and stops feeling like he is choking however the last time he had 1 of those episodes he also passed out then.  He states he is never eating or drinking anything when this happens. He does report occasional cocaine use, last time was 3 days ago along with drinking alcohol about 3 days a week.  He denies any history of injection drug use.  He denies any chest pain or palpitations during these episodes.    HPI     History reviewed. No pertinent past medical history.  Patient Active Problem List   Diagnosis Date Noted  . Syncope 01/22/2021    History reviewed. No pertinent surgical history.     Family History  Problem Relation Age of Onset  . Sudden Cardiac Death Neg Hx     Social History   Tobacco Use  . Smoking status: Current Every Day Smoker    Packs/day: 1.00    Types: Cigarettes  Substance Use Topics  . Alcohol use: Yes  . Drug use: Yes    Types: Cocaine    Comment: Last used cocaine xfew dys ago     Home Medications Prior to Admission medications   Medication Sig Start Date End Date Taking? Authorizing Provider  acetaminophen (TYLENOL) 500 MG tablet Take 500 mg by mouth every 6 (six) hours as needed for moderate pain.   Yes [provider]    Allergies    Diphenhydramine-apap (sleep)  Review of Systems   Review of Systems  Constitutional: Negative for chills and fever.  HENT: Negative for congestion.   Respiratory: Positive for shortness of breath.   Cardiovascular: Negative for chest pain.  Gastrointestinal: Negative for abdominal pain, diarrhea, nausea and vomiting.  Genitourinary: Negative for dysuria.  Musculoskeletal: Negative for back pain and neck pain.  Neurological: Positive for syncope.  All other systems reviewed and are negative.   Physical Exam Updated Vital Signs BP 119/79   Pulse 66   Temp 98.5 F (36.9 C) (Oral)   Resp 20   Ht 5\' 5"  (1.651 m)   Wt 93 kg   SpO2 96%   BMI 34.11 kg/m   Physical Exam Vitals and nursing note reviewed.  Constitutional:      General: He is not in acute distress.    Appearance: He is not diaphoretic.  HENT:     Head: Normocephalic and  atraumatic.  Eyes:     General: No scleral icterus.       Right eye: No discharge.        Left eye: No discharge.     Conjunctiva/sclera: Conjunctivae normal.  Cardiovascular:     Rate and Rhythm: Normal rate and regular rhythm.     Pulses: Normal pulses.     Heart sounds: Normal heart sounds.  Pulmonary:     Effort: Pulmonary effort is normal. No respiratory distress.     Breath sounds: Normal breath sounds. No stridor.  Abdominal:     General: There is no distension.     Tenderness: There is no abdominal tenderness. There is no guarding.  Musculoskeletal:        General: No deformity.     Cervical back: Normal range of motion.     Comments: Mild diffuse midline C-spine tenderness to palpation.  There is no midline tenderness to palpation, step-offs, or deformities  in T/L-spine, specifically no correlating point tenderness over abnormal area on x-ray.  Skin:    General: Skin is warm and dry.  Neurological:     General: No focal deficit present.     Mental Status: He is alert and oriented to person, place, and time.     Motor: No abnormal muscle tone.  Psychiatric:        Mood and Affect: Mood normal.        Behavior: Behavior normal.     ED Results / Procedures / Treatments   Labs (all labs ordered are listed, but only abnormal results are displayed) Labs Reviewed  BASIC METABOLIC PANEL - Abnormal; Notable for the following components:      Result Value   Glucose, Bld 161 (*)    All other components within normal limits  RAPID URINE DRUG SCREEN, HOSP PERFORMED - Abnormal; Notable for the following components:   Cocaine POSITIVE (*)    All other components within normal limits  RESP PANEL BY RT-PCR (FLU A&B, COVID) ARPGX2  CBC  URINALYSIS, ROUTINE W REFLEX MICROSCOPIC  HEPATIC FUNCTION PANEL  LIPASE, BLOOD  ETHANOL  HIV ANTIBODY (ROUTINE TESTING W REFLEX)  TROPONIN I (HIGH SENSITIVITY)  TROPONIN I (HIGH SENSITIVITY)    EKG EKG Interpretation  Date/Time:  Wednesday January 22 2021 17:36:42 EST Ventricular Rate:  80 PR Interval:    QRS Duration: 84 QT Interval:  366 QTC Calculation: 423 R Axis:   24 Text Interpretation: Sinus rhythm Borderline T wave abnormalities 12 Lead; Mason-Likar No significant change since last tracing Confirmed by Alvira Monday ([id number]) on 01/22/2021 7:27:47 PM   Radiology DG Chest 2 View  Result Date: 01/22/2021 CLINICAL DATA:  Syncope. "passed out" and fell hitting head-does not know how long he was out. EXAM: X-ray ribs 11/11/2013 FINDINGS: The heart size and mediastinal contours are within normal limits. No focal consolidation. No pulmonary edema. No pleural effusion. No pneumothorax. No acute osseous abnormality. Possible slight wedge compression deformity of a midthoracic vertebral body. IMPRESSION: 1.  No active cardiopulmonary disease. 2. Possible slight wedge compression deformity of a midthoracic vertebral body. Correlate with midline point tenderness to palpation to evaluate for an acute component. Electronically Signed   By: Tish Frederickson M.D.   On: 01/22/2021 19:47   CT Head Wo Contrast  Result Date: 01/22/2021 CLINICAL DATA:  Un witnessed fall, hit head EXAM: CT HEAD WITHOUT CONTRAST TECHNIQUE: Contiguous axial images were obtained from the base of the skull through the vertex without intravenous contrast. COMPARISON:  04/02/2017  FINDINGS: Brain: No acute infarct or hemorrhage. Lateral ventricles and midline structures are unremarkable. No acute extra-axial fluid collections. No mass effect. Vascular: No hyperdense vessel or unexpected calcification. Skull: Normal. Negative for fracture or focal lesion. Sinuses/Orbits: Mild mucosal thickening left maxillary sinus. Remaining paranasal sinuses are clear. Other: None. IMPRESSION: 1. No acute intracranial process. 2. Minimal left maxillary sinus disease. Electronically Signed   By: Sharlet Salina M.D.   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 Neck trauma, midline tenderness. EXAM: CT CERVICAL SPINE WITHOUT CONTRAST TECHNIQUE: Multidetector CT imaging of the cervical spine was performed without intravenous contrast. Multiplanar CT image reconstructions were also generated. COMPARISON:  X-ray cervical spine 05/30/2008. FINDINGS: Alignment: Normal. Skull base and vertebrae: Multilevel mild-to-moderate degenerative changes of the spine. No acute fracture. No aggressive appearing focal osseous lesion or focal pathologic process. Soft tissues and spinal canal: No prevertebral fluid or swelling. No visible canal hematoma. Upper chest: Unremarkable. Other: Mild atherosclerotic plaque of the carotid arteries within the neck. IMPRESSION: No acute  displaced fracture or traumatic listhesis of the cervical spine. Electronically Signed   By: Tish Frederickson M.D.   On: 01/22/2021 19:14    Procedures Procedures   Medications Ordered in ED Medications  sodium chloride flush (NS) 0.9 % injection 3 mL (has no administration in time range)  enoxaparin (LOVENOX) injection 45 mg (has no administration in time range)  acetaminophen (TYLENOL) tablet 650 mg (has no administration in time range)    Or  acetaminophen (TYLENOL) suppository 650 mg (has no administration in time range)  ondansetron (ZOFRAN) tablet 4 mg (has no administration in time range)    Or  ondansetron (ZOFRAN) injection 4 mg (has no administration in time range)    ED Course  I have reviewed the triage vital signs and the nursing notes.  Pertinent labs & imaging results that were available during my care of the patient were reviewed by me and considered in my medical decision making (see chart for details).    MDM Rules/Calculators/A&P                          Patient is a 45 year old man who presents today for evaluation of recurrent syncope.  He has had intermittent episodes of shortness of breath about once every 1 to 2 months that have previously happened at night, however the last time this happened he had a resulting syncopal event.  Today he had an episode for the first time during the day which caused him to lose consciousness and fall off his parked forklift. CT head and neck are obtained without evidence of acute abnormalities.  There was a possible compression deformity on the chest x-ray noted in the thoracic spine however on exam he does not have correlating pain or point tenderness, doubt this is a true acute fracture.  EKG is obtained with borderline T wave abnormalities.  Labs are obtained and reviewed, Covid test is negative, CBC, BMP, hepatic function panel and UA are all normal.  Troponin is not elevated.  UDS is positive for cocaine, patient reports last  use was about 3 days ago. Given that patient had shortness of breath prior to the syncopal event he does not fall into the low risk syncopal category according to [facility] syncope rules. I have recommended admission. I spoke with Dr. Julian Reil  who will see the patient for admission.  The patient appears reasonably stabilized for admission considering the current resources, flow, and capabilities available in the ED at this time, and I doubt any other [facility] requiring further screening and/or treatment in the ED prior to admission.  Final Clinical Impression(s) / ED Diagnoses Final diagnoses:  Syncope and collapse    Rx / DC Orders ED Discharge Orders    None       Norman Clay 01/22/21 [id number]    Alvira Monday, MD 01/24/21 [phone]

## 2021-01-23 ENCOUNTER — Observation Stay (HOSPITAL_BASED_OUTPATIENT_CLINIC_OR_DEPARTMENT_OTHER): Payer: 59

## 2021-01-23 ENCOUNTER — Encounter (HOSPITAL_COMMUNITY): Payer: Self-pay | Admitting: Internal Medicine

## 2021-01-23 DIAGNOSIS — R55 Syncope and collapse: Secondary | ICD-10-CM

## 2021-01-23 LAB — ECHOCARDIOGRAM COMPLETE
Area-P 1/2: 2.42 cm2
Height: 65 in
S' Lateral: 3.4 cm
Weight: 3093.49 oz

## 2021-01-23 LAB — HIV ANTIBODY (ROUTINE TESTING W REFLEX): HIV Screen 4th Generation wRfx: NONREACTIVE

## 2021-01-23 LAB — GLUCOSE, CAPILLARY: Glucose-Capillary: 126 mg/dL — ABNORMAL HIGH (ref 70–99)

## 2021-01-23 NOTE — Plan of Care (Signed)
  Problem: Education: Goal: Knowledge of condition and prescribed therapy will improve Outcome: Adequate for Discharge   Problem: Cardiac: Goal: Will achieve and/or maintain adequate cardiac output Outcome: Adequate for Discharge   Problem: Physical Regulation: Goal: Complications related to the disease process, condition or treatment will be avoided or minimized Outcome: Adequate for Discharge   

## 2021-01-23 NOTE — Discharge Instructions (Signed)
Syncope Syncope is when you pass out (faint) for a short time. It is caused by a sudden decrease in blood flow to the brain. Signs that you may be about to pass out include:  Feeling dizzy or light-headed.  Feeling sick to your stomach (nauseous).  Seeing all white or all black.  Having cold, clammy skin. If you pass out, get help right away. Call your local emergency services (911 in the [place] Do not drive yourself to the hospital. Follow these instructions at home: Watch for any changes in your symptoms. Take these actions to stay safe and help with your symptoms: Lifestyle  Do not drive, use machinery, or play sports until your doctor says it is okay.  Do not drink alcohol.  Do not use any products that contain nicotine or tobacco, such as cigarettes and e-cigarettes. If you need help quitting, ask your doctor.  Drink enough fluid to keep your pee (urine) pale yellow. General instructions  Take over-the-counter and prescription medicines only as told by your doctor.  If you are taking blood pressure or heart medicine, sit up and stand up slowly. Spend a few minutes getting ready to sit and then stand. This can help you feel less dizzy.  Have someone stay with you until you feel stable.  If you start to feel like you might pass out, lie down right away and raise (elevate) your feet above the level of your heart. Breathe deeply and steadily. Wait until all of the symptoms are gone.  Keep all follow-up visits as told by your doctor. This is important. Get help right away if:  You have a very bad headache.  You pass out once or more than once.  You have pain in your chest, belly, or back.  You have a very fast or uneven heartbeat (palpitations).  It hurts to breathe.  You are bleeding from your mouth or your bottom (rectum).  You have black or tarry poop (stool).  You have jerky movements that you cannot control (seizure).  You are confused.  You have trouble  walking.  You are very weak.  You have vision problems. These symptoms may be an emergency. Do not wait to see if the symptoms will go away. Get medical help right away. Call your local emergency services (911 in the [place] Do not drive yourself to the hospital. Summary  Syncope is when you pass out (faint) for a short time. It is caused by a sudden decrease in blood flow to the brain.  Signs that you may be about to faint include feeling dizzy, light-headed, or sick to your stomach, seeing all white or all black, or having cold, clammy skin.  If you start to feel like you might pass out, lie down right away and raise (elevate) your feet above the level of your heart. Breathe deeply and steadily. Wait until all of the symptoms are gone. This information is not intended to replace advice given to you by your health care provider. Make sure you discuss any questions you have with your health care provider. Document Revised: 12/20/2019 Document Reviewed: 12/22/2017 Elsevier Patient Education  2021 [employer]  

## 2021-01-23 NOTE — Progress Notes (Signed)
  Echocardiogram 2D Echocardiogram has been performed.  Cristian White Cristian White 01/23/2021, 1:49 PM

## 2021-01-23 NOTE — Discharge Summary (Signed)
Physician Discharge Summary  Cristian White YJE:[id number] DOB: 1976/02/21 DOA: 01/22/2021  PCP: Patient, No Pcp Per  Admit date: 01/22/2021 Discharge date: 01/23/2021  Admitted From: Home Disposition:  Home  Recommendations for Outpatient Follow-up:  1. Follow up with PCP in 1-2 weeks 2. Please obtain BMP/CBC in one week 3. Please follow up on the following pending results: Echocardiogram results  Home Health: No Equipment/Devices: None Discharge Condition: Stable CODE STATUS: Full Diet recommendation: Heart Healthy   Brief/Interim Summary: Cristian White is a 45 y.o. male with no significant PMH.  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  He states he started having SOB, feeling like he was choking and unable to breath, then awakened on floor by coworkers, presumably fell out of forklift.  Had similar episode 1 month ago.  Patient occasionally uses cocaine, last use was 3 days ago.  Drinks alcohol 2-3 times a week.  UDS was positive for cocaine.  Labs and imaging was negative.  EKG without any changes, troponin remain negative.  CT head without any acute abnormality.  Echocardiogram done-pending results.  Patient was feeling completely back to his baseline when seen today.  And wants to get out of the hospital as soon as possible.  Syncopal episodes can occur due to vasoconstriction secondary to cocaine use.  Patient was advised and counseled against cocaine use and he will follow-up with his primary care provider for further recommendations.  If he continued to have these episodes he needs to follow-up with cardiology for an event monitor.  Discharge Diagnoses:  Active Problems:   Syncope and collapse   Discharge Instructions  Discharge Instructions    Diet - low sodium heart healthy   Complete by: As directed    Discharge instructions   Complete by: As directed    It was pleasure taking care  of you. It is important that you stop using cocaine to prevent this type of episodes or even worsening damage which can include stroke and heart attack. Keep yourself well-hydrated and follow-up with your primary care provider.   Increase activity slowly   Complete by: As directed      Allergies as of 01/23/2021      Reactions   Diphenhydramine-apap (sleep)    Fainting, passes out      Medication List    TAKE these medications   acetaminophen 500 MG tablet Commonly known as: TYLENOL Take 500 mg by mouth every 6 (six) hours as needed for moderate pain.       Allergies  Allergen Reactions  . Diphenhydramine-Apap (Sleep)     Fainting, passes out    Consultations:  None  Procedures/Studies: DG Chest 2 View  Result Date: 01/22/2021 CLINICAL DATA:  Syncope. "passed out" and fell hitting head-does not know how long he was out. EXAM: X-ray ribs 11/11/2013 FINDINGS: The heart size and mediastinal contours are within normal limits. No focal consolidation. No pulmonary edema. No pleural effusion. No pneumothorax. No acute osseous abnormality. Possible slight wedge compression deformity of a midthoracic vertebral body. IMPRESSION: 1. No active cardiopulmonary disease. 2. Possible slight wedge compression deformity of a midthoracic vertebral body. Correlate with midline point tenderness to palpation to evaluate for an acute component. Electronically Signed   By: Tish Frederickson M.D.   On: 01/22/2021 19:47   CT Head Wo Contrast  Result Date: 01/22/2021 CLINICAL DATA:  Un witnessed fall, hit head EXAM: CT HEAD WITHOUT CONTRAST TECHNIQUE:  Contiguous axial images were obtained from the base of the skull through the vertex without intravenous contrast. COMPARISON:  04/02/2017 FINDINGS: Brain: No acute infarct or hemorrhage. Lateral ventricles and midline structures are unremarkable. No acute extra-axial fluid collections. No mass effect. Vascular: No hyperdense vessel or unexpected calcification.  Skull: Normal. Negative for fracture or focal lesion. Sinuses/Orbits: Mild mucosal thickening left maxillary sinus. Remaining paranasal sinuses are clear. Other: None. IMPRESSION: 1. No acute intracranial process. 2. Minimal left maxillary sinus disease. Electronically Signed   By: Sharlet SalinaMichael  Brown M.D.   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 Neck trauma, midline tenderness. EXAM: CT CERVICAL SPINE WITHOUT CONTRAST TECHNIQUE: Multidetector CT imaging of the cervical spine was performed without intravenous contrast. Multiplanar CT image reconstructions were also generated. COMPARISON:  X-ray cervical spine 05/30/2008. FINDINGS: Alignment: Normal. Skull base and vertebrae: Multilevel mild-to-moderate degenerative changes of the spine. No acute fracture. No aggressive appearing focal osseous lesion or focal pathologic process. Soft tissues and spinal canal: No prevertebral fluid or swelling. No visible canal hematoma. Upper chest: Unremarkable. Other: Mild atherosclerotic plaque of the carotid arteries within the neck. IMPRESSION: No acute displaced fracture or traumatic listhesis of the cervical spine. Electronically Signed   By: Tish FredericksonMorgane  Naveau M.D.   On: 01/22/2021 19:14     Subjective: Patient was feeling better when seen today.  Eating his lunch.  Wants to get out of the hospital.  Denies any chest pain or shortness of breath.  We discussed about his cocaine use and how that can be related to these episodes. If he continued to have these episodes he needs to follow-up with cardiology for an event monitor.  Discharge Exam: Vitals:   01/23/21 0807 01/23/21 1209  BP: 137/89 (!) 116/91  Pulse: 66 74  Resp: 18 18  Temp: 97.8 F (36.6 C) 98.1 F (36.7 C)  SpO2: 97% 97%   Vitals:   01/23/21 0350 01/23/21 0351 01/23/21 0807 01/23/21 1209  BP: 118/87  137/89 (!) 116/91  Pulse: 69  66  74  Resp: 18  18 18   Temp: 97.7 F (36.5 C)  97.8 F (36.6 C) 98.1 F (36.7 C)  TempSrc: Oral  Oral Oral  SpO2: 96%  97% 97%  Weight:  87.7 kg    Height:        General: Pt is alert, awake, not in acute distress Cardiovascular: RRR, S1/S2 +, no rubs, no gallops Respiratory: CTA bilaterally, no wheezing, no rhonchi Abdominal: Soft, NT, ND, bowel sounds + Extremities: no edema, no cyanosis   The results of significant diagnostics from this hospitalization (including imaging, microbiology, ancillary and laboratory) are listed below for reference.    Microbiology: Recent Results (from the past 240 hour(s))  Resp Panel by RT-PCR (Flu A&B, Covid) Nasopharyngeal Swab     Status: None   Collection Time: 01/22/21  9:25 PM   Specimen: Nasopharyngeal Swab; Nasopharyngeal(NP) swabs in vial transport medium  Result Value Ref Range Status   SARS Coronavirus 2 by RT PCR NEGATIVE NEGATIVE Final    Comment: (NOTE) SARS-CoV-2 target nucleic acids are NOT DETECTED.  The SARS-CoV-2 RNA is generally detectable in upper respiratory specimens during the acute phase of infection. The lowest concentration of SARS-CoV-2 viral copies this assay can detect is 138 copies/mL. A negative result does not preclude SARS-Cov-2 infection and should not be used as the sole basis  for treatment or other patient management decisions. A negative result may occur with  improper specimen collection/handling, submission of specimen other than nasopharyngeal swab, presence of viral mutation(s) within the areas targeted by this assay, and inadequate number of viral copies(<138 copies/mL). A negative result must be combined with clinical observations, patient history, and epidemiological information. The expected result is Negative.  Fact Sheet for Patients:  BloggerCourse.com  Fact Sheet for Healthcare Providers:  SeriousBroker.it  This test is no t yet approved  or cleared by the [place] FDA and  has been authorized for detection and/or diagnosis of SARS-CoV-2 by FDA under an Emergency Use Authorization (EUA). This EUA will remain  in effect (meaning this test can be used) for the duration of the COVID-19 declaration under Section 564(b)(1) of the Act, 21 U.S.C.section 360bbb-3(b)(1), unless the authorization is terminated  or revoked sooner.       Influenza A by PCR NEGATIVE NEGATIVE Final   Influenza B by PCR NEGATIVE NEGATIVE Final    Comment: (NOTE) The Xpert Xpress SARS-CoV-2/FLU/RSV plus assay is intended as an aid in the diagnosis of influenza from Nasopharyngeal swab specimens and should not be used as a sole basis for treatment. Nasal washings and aspirates are unacceptable for Xpert Xpress SARS-CoV-2/FLU/RSV testing.  Fact Sheet for Patients: BloggerCourse.com  Fact Sheet for Healthcare Providers: SeriousBroker.it  This test is not yet approved or cleared by the [place] FDA and has been authorized for detection and/or diagnosis of SARS-CoV-2 by FDA under an Emergency Use Authorization (EUA). This EUA will remain in effect (meaning this test can be used) for the duration of the COVID-19 declaration under Section 564(b)(1) of the Act, 21 U.S.C. section 360bbb-3(b)(1), unless the authorization is terminated or revoked.  Performed at [facility], [address], [place], [place] [address]      Labs: BNP (last 3 results) No results for input(s): BNP in the last 8760 hours. Basic Metabolic Panel: Recent Labs  Lab 01/22/21 1748  NA 138  K 3.9  CL 103  CO2 24  GLUCOSE 161*  BUN 11  CREATININE 0.83  CALCIUM 9.6   Liver Function Tests: Recent Labs  Lab 01/22/21 1922  AST 26  ALT 34  ALKPHOS 73  BILITOT 1.0  PROT 7.2  ALBUMIN 4.5   Recent Labs  Lab 01/22/21 1922  LIPASE 25   No results for input(s): AMMONIA in the last 168  hours. CBC: Recent Labs  Lab 01/22/21 1748  WBC 10.5  HGB 15.5  HCT 46.9  MCV 91.2  PLT 191   Cardiac Enzymes: No results for input(s): CKTOTAL, CKMB, CKMBINDEX, TROPONINI in the last 168 hours. BNP: Invalid input(s): POCBNP CBG: Recent Labs  Lab 01/23/21 0354  GLUCAP 126*   D-Dimer No results for input(s): DDIMER in the last 72 hours. Hgb A1c No results for input(s): HGBA1C in the last 72 hours. Lipid Profile No results for input(s): CHOL, HDL, LDLCALC, TRIG, CHOLHDL, LDLDIRECT in the last 72 hours. Thyroid function studies No results for input(s): TSH, T4TOTAL, T3FREE, THYROIDAB in the last 72 hours.  Invalid input(s): FREET3 Anemia work up No results for input(s): VITAMINB12, FOLATE, FERRITIN, TIBC, IRON, RETICCTPCT in the last 72 hours. Urinalysis    Component Value Date/Time   COLORURINE YELLOW 01/22/2021 2030   APPEARANCEUR CLEAR 01/22/2021 2030   LABSPEC 1.011 01/22/2021 2030   PHURINE 5.0 01/22/2021 2030   GLUCOSEU NEGATIVE 01/22/2021 2030   HGBUR NEGATIVE 01/22/2021 2030   BILIRUBINUR NEGATIVE 01/22/2021  2030   KETONESUR NEGATIVE 01/22/2021 2030   PROTEINUR NEGATIVE 01/22/2021 2030   UROBILINOGEN 0.2 11/11/2013 1615   NITRITE NEGATIVE 01/22/2021 2030   LEUKOCYTESUR NEGATIVE 01/22/2021 2030   Sepsis Labs Invalid input(s): PROCALCITONIN,  WBC,  LACTICIDVEN Microbiology Recent Results (from the past 240 hour(s))  Resp Panel by RT-PCR (Flu A&B, Covid) Nasopharyngeal Swab     Status: None   Collection Time: 01/22/21  9:25 PM   Specimen: Nasopharyngeal Swab; Nasopharyngeal(NP) swabs in vial transport medium  Result Value Ref Range Status   SARS Coronavirus 2 by RT PCR NEGATIVE NEGATIVE Final    Comment: (NOTE) SARS-CoV-2 target nucleic acids are NOT DETECTED.  The SARS-CoV-2 RNA is generally detectable in upper respiratory specimens during the acute phase of infection. The lowest concentration of SARS-CoV-2 viral copies this assay can detect is 138  copies/mL. A negative result does not preclude SARS-Cov-2 infection and should not be used as the sole basis for treatment or other patient management decisions. A negative result may occur with  improper specimen collection/handling, submission of specimen other than nasopharyngeal swab, presence of viral mutation(s) within the areas targeted by this assay, and inadequate number of viral copies(<138 copies/mL). A negative result must be combined with clinical observations, patient history, and epidemiological information. The expected result is Negative.  Fact Sheet for Patients:  BloggerCourse.com  Fact Sheet for Healthcare Providers:  SeriousBroker.it  This test is no t yet approved or cleared by the [place] FDA and  has been authorized for detection and/or diagnosis of SARS-CoV-2 by FDA under an Emergency Use Authorization (EUA). This EUA will remain  in effect (meaning this test can be used) for the duration of the COVID-19 declaration under Section 564(b)(1) of the Act, 21 U.S.C.section 360bbb-3(b)(1), unless the authorization is terminated  or revoked sooner.       Influenza A by PCR NEGATIVE NEGATIVE Final   Influenza B by PCR NEGATIVE NEGATIVE Final    Comment: (NOTE) The Xpert Xpress SARS-CoV-2/FLU/RSV plus assay is intended as an aid in the diagnosis of influenza from Nasopharyngeal swab specimens and should not be used as a sole basis for treatment. Nasal washings and aspirates are unacceptable for Xpert Xpress SARS-CoV-2/FLU/RSV testing.  Fact Sheet for Patients: BloggerCourse.com  Fact Sheet for Healthcare Providers: SeriousBroker.it  This test is not yet approved or cleared by the [place] FDA and has been authorized for detection and/or diagnosis of SARS-CoV-2 by FDA under an Emergency Use Authorization (EUA). This EUA will remain in effect (meaning  this test can be used) for the duration of the COVID-19 declaration under Section 564(b)(1) of the Act, 21 U.S.C. section 360bbb-3(b)(1), unless the authorization is terminated or revoked.  Performed at [facility], [address], [place], [place] [address]     Time coordinating discharge: Over 30 minutes  SIGNED:  Arnetha Courser, MD  [facility] 01/23/2021, 2:44 PM  If 7PM-7AM, please contact night-coverage [email or web address]  This record has been created using Conservation officer, historic buildings. Errors have been sought and corrected,but may not always be located. Such creation errors do not reflect on the standard of care.

## 2021-09-22 ENCOUNTER — Emergency Department (HOSPITAL_COMMUNITY)
Admission: EM | Admit: 2021-09-22 | Discharge: 2021-09-23 | Disposition: A | Discharge status: Home / Self Care | Payer: 59 | Attending: Emergency Medicine | Admitting: Emergency Medicine

## 2021-09-22 DIAGNOSIS — K0889 Other specified disorders of teeth and supporting structures: Secondary | ICD-10-CM | POA: Insufficient documentation

## 2021-09-22 DIAGNOSIS — Z5321 Procedure and treatment not carried out due to patient leaving prior to being seen by health care provider: Secondary | ICD-10-CM | POA: Diagnosis not present

## 2021-09-23 ENCOUNTER — Encounter (HOSPITAL_COMMUNITY): Payer: Self-pay

## 2021-09-23 ENCOUNTER — Other Ambulatory Visit: Payer: Self-pay

## 2021-09-23 NOTE — ED Triage Notes (Signed)
Pt complains of dental pain since yesterday. Pt says that his right upper tooth is broke in half.

## 2021-12-28 ENCOUNTER — Emergency Department (HOSPITAL_COMMUNITY): Payer: Self-pay

## 2021-12-28 ENCOUNTER — Encounter (HOSPITAL_COMMUNITY): Payer: Self-pay

## 2021-12-28 ENCOUNTER — Other Ambulatory Visit: Payer: Self-pay

## 2021-12-28 ENCOUNTER — Emergency Department (HOSPITAL_COMMUNITY)
Admission: EM | Admit: 2021-12-28 | Discharge: 2021-12-28 | Disposition: A | Discharge status: Home / Self Care | Payer: Self-pay | Attending: Emergency Medicine | Admitting: Emergency Medicine

## 2021-12-28 DIAGNOSIS — E119 Type 2 diabetes mellitus without complications: Secondary | ICD-10-CM | POA: Insufficient documentation

## 2021-12-28 DIAGNOSIS — Z20822 Contact with and (suspected) exposure to covid-19: Secondary | ICD-10-CM | POA: Insufficient documentation

## 2021-12-28 DIAGNOSIS — Z7984 Long term (current) use of oral hypoglycemic drugs: Secondary | ICD-10-CM | POA: Insufficient documentation

## 2021-12-28 LAB — URINALYSIS, ROUTINE W REFLEX MICROSCOPIC
Bacteria, UA: NONE SEEN
Bilirubin Urine: NEGATIVE
Glucose, UA: 250 mg/dL — AB
Hgb urine dipstick: NEGATIVE
Ketones, ur: 15 mg/dL — AB
Leukocytes,Ua: NEGATIVE
Nitrite: NEGATIVE
Protein, ur: NEGATIVE mg/dL
Specific Gravity, Urine: 1.015 (ref 1.005–1.030)
pH: 6 (ref 5.0–8.0)

## 2021-12-28 LAB — CBC WITH DIFFERENTIAL/PLATELET
Abs Immature Granulocytes: 0.01 10*3/uL (ref 0.00–0.07)
Basophils Absolute: 0.1 10*3/uL (ref 0.0–0.1)
Basophils Relative: 1 %
Eosinophils Absolute: 0.1 10*3/uL (ref 0.0–0.5)
Eosinophils Relative: 1 %
HCT: 44.1 % (ref 39.0–52.0)
Hemoglobin: 16.5 g/dL (ref 13.0–17.0)
Immature Granulocytes: 0 %
Lymphocytes Relative: 50 %
Lymphs Abs: 3.3 10*3/uL (ref 0.7–4.0)
MCH: 31.8 pg (ref 26.0–34.0)
MCHC: 37.4 g/dL — ABNORMAL HIGH (ref 30.0–36.0)
MCV: 85 fL (ref 80.0–100.0)
Monocytes Absolute: 0.5 10*3/uL (ref 0.1–1.0)
Monocytes Relative: 8 %
Neutro Abs: 2.7 10*3/uL (ref 1.7–7.7)
Neutrophils Relative %: 40 %
Platelets: 185 10*3/uL (ref 150–400)
RBC: 5.19 MIL/uL (ref 4.22–5.81)
RDW: 11.5 % (ref 11.5–15.5)
WBC: 6.6 10*3/uL (ref 4.0–10.5)
nRBC: 0 % (ref 0.0–0.2)

## 2021-12-28 LAB — BLOOD GAS, VENOUS
Acid-Base Excess: 1.4 mmol/L (ref 0.0–2.0)
Bicarbonate: 27.4 mmol/L (ref 20.0–28.0)
O2 Saturation: 52.1 %
Patient temperature: 98.6
pCO2, Ven: 50.4 mmHg (ref 44.0–60.0)
pH, Ven: 7.355 (ref 7.250–7.430)
pO2, Ven: <31 mmHg — CL (ref 32.0–45.0)

## 2021-12-28 LAB — COMPREHENSIVE METABOLIC PANEL
ALT: 37 U/L (ref 0–44)
AST: 27 U/L (ref 15–41)
Albumin: 4.1 g/dL (ref 3.5–5.0)
Alkaline Phosphatase: 103 U/L (ref 38–126)
Anion gap: 14 (ref 5–15)
BUN: 13 mg/dL (ref 6–20)
CO2: 24 mmol/L (ref 22–32)
Calcium: 9.6 mg/dL (ref 8.9–10.3)
Chloride: 94 mmol/L — ABNORMAL LOW (ref 98–111)
Creatinine, Ser: 0.94 mg/dL (ref 0.61–1.24)
GFR, Estimated: >60 mL/min (ref >60)
Glucose, Bld: 452 mg/dL — ABNORMAL HIGH (ref 70–99)
Potassium: 4.6 mmol/L (ref 3.5–5.1)
Sodium: 132 mmol/L — ABNORMAL LOW (ref 135–145)
Total Bilirubin: 2.6 mg/dL — ABNORMAL HIGH (ref 0.3–1.2)
Total Protein: 7.3 g/dL (ref 6.5–8.1)

## 2021-12-28 LAB — RESP PANEL BY RT-PCR (FLU A&B, COVID) ARPGX2
Influenza A by PCR: NEGATIVE
Influenza B by PCR: NEGATIVE
SARS Coronavirus 2 by RT PCR: NEGATIVE

## 2021-12-28 LAB — CBG MONITORING, ED
Glucose-Capillary: 403 mg/dL — ABNORMAL HIGH (ref 70–99)
Glucose-Capillary: 325 mg/dL — ABNORMAL HIGH (ref 70–99)

## 2021-12-28 MED ORDER — METFORMIN HCL 500 MG PO TABS: 500.0000 mg | ORAL_TABLET | Freq: Two times a day (BID) | ORAL | 0 refills | Status: DC

## 2021-12-28 MED ORDER — SODIUM CHLORIDE 0.9 % IV BOLUS
1000.0000 mL | Freq: Once | INTRAVENOUS | Status: AC
  Administered 2021-12-28: 1000 mL via INTRAVENOUS

## 2021-12-28 NOTE — Discharge Instructions (Addendum)
You need to follow-up with your primary care doctor urgently for management of your diabetes.  I have consulted our social worker to help assist with this.  I have provided you with nutrition guidelines to help manage your sugars.  Return to emergency room if you have any worsening symptoms.

## 2021-12-28 NOTE — ED Triage Notes (Signed)
Pt reports headache that occurred Monday and has had blurred vision since then. He reports increased thirst, but loss of appetite. Denies any other symptoms at this time.

## 2021-12-28 NOTE — ED Provider Notes (Signed)
[place] DEPT Provider Note   CSN: [id number] Arrival date & time: 12/28/21  [id number]     History  Chief Complaint  Patient presents with   Headache   Blurred Vision    Cristian White is a 46 y.o. male.  Patient is a 46 year old male who presents with blurred vision.  He states that 5 days ago he had a headache.  He says it was a typical headache and was bifrontal.  He went to sleep and when he woke up the next morning he had blurry vision.  He has had blurry vision since that time.  He does not have any loss of vision or double vision, he just says it is blurry.  He also has increased thirst and a dry mouth since then.  He has not had any further headaches since that initial 1.  No beach deficits.  No difficulty with his balance.  No dizziness.  No recent illnesses.  He has had a little bit of a cough but otherwise no chest pain or shortness of breath.  No fevers.  No vomiting or diarrhea.  No known past medical issues.  Does not report any eye pain.  He says his eyes are little bit watery but no other discharge.      Home Medications Prior to Admission medications   Medication Sig Start Date End Date Taking? Authorizing Provider  Amoxicillin (AMOXIL PO) Take 1 tablet by mouth daily as needed (mouth pain).   Yes [provider]  ibuprofen (ADVIL) 100 MG tablet Take 100 mg by mouth every 6 (six) hours as needed for fever or pain.   Yes [provider]  metFORMIN (GLUCOPHAGE) 500 MG tablet Take 1 tablet (500 mg total) by mouth 2 (two) times daily with a meal. 12/28/21  Yes Malvin Johns, MD      Allergies    Diphenhydramine-apap (sleep)    Review of Systems   Review of Systems  Constitutional:  Positive for fatigue. Negative for chills, diaphoresis and fever.  HENT:  Negative for congestion, rhinorrhea and sneezing.   Eyes:  Positive for visual disturbance.  Respiratory:  Negative for cough, chest tightness and shortness of breath.    Cardiovascular:  Negative for chest pain and leg swelling.  Gastrointestinal:  Negative for abdominal pain, blood in stool, diarrhea, nausea and vomiting.  Endocrine: Positive for polydipsia and polyuria.  Genitourinary:  Negative for difficulty urinating, flank pain, frequency and hematuria.  Musculoskeletal:  Negative for arthralgias and back pain.  Skin:  Negative for rash.  Neurological:  Negative for dizziness, speech difficulty, weakness, numbness and headaches.   Physical Exam Updated Vital Signs BP (!) 139/98    Pulse 65    Temp 97.9 F (36.6 C) (Oral)    Resp 18    SpO2 98%  Physical Exam Constitutional:      Appearance: He is well-developed.  HENT:     Head: Normocephalic and atraumatic.  Eyes:     Extraocular Movements: Extraocular movements intact.     Conjunctiva/sclera: Conjunctivae normal.     Pupils: Pupils are equal, round, and reactive to light.  Cardiovascular:     Rate and Rhythm: Normal rate and regular rhythm.     Heart sounds: Normal heart sounds.  Pulmonary:     Effort: Pulmonary effort is normal. No respiratory distress.     Breath sounds: Normal breath sounds. No wheezing or rales.  Chest:     Chest wall: No tenderness.  Abdominal:  General: Bowel sounds are normal.     Palpations: Abdomen is soft.     Tenderness: There is no abdominal tenderness. There is no guarding or rebound.  Musculoskeletal:        General: Normal range of motion.     Cervical back: Normal range of motion and neck supple.  Lymphadenopathy:     Cervical: No cervical adenopathy.  Skin:    General: Skin is warm and dry.     Findings: No rash.  Neurological:     Mental Status: He is alert and oriented to person, place, and time.    ED Results / Procedures / Treatments   Labs (all labs ordered are listed, but only abnormal results are displayed) Labs Reviewed  CBC WITH DIFFERENTIAL/PLATELET - Abnormal; Notable for the following components:      Result Value   MCHC  37.4 (*)    All other components within normal limits  COMPREHENSIVE METABOLIC PANEL - Abnormal; Notable for the following components:   Sodium 132 (*)    Chloride 94 (*)    Glucose, Bld 452 (*)    Total Bilirubin 2.6 (*)    All other components within normal limits  URINALYSIS, ROUTINE W REFLEX MICROSCOPIC - Abnormal; Notable for the following components:   Glucose, UA 250 (*)    Ketones, ur 15 (*)    All other components within normal limits  BLOOD GAS, VENOUS - Abnormal; Notable for the following components:   pO2, Ven <31.0 (*)    All other components within normal limits  CBG MONITORING, ED - Abnormal; Notable for the following components:   Glucose-Capillary 403 (*)    All other components within normal limits  CBG MONITORING, ED - Abnormal; Notable for the following components:   Glucose-Capillary 325 (*)    All other components within normal limits  RESP PANEL BY RT-PCR (FLU A&B, COVID) [id number]  CBG MONITORING, ED    EKG EKG Interpretation  Date/Time:  Sunday December 28 2021 08:58:39 EST Ventricular Rate:  75 PR Interval:  140 QRS Duration: 90 QT Interval:  387 QTC Calculation: 433 R Axis:   12 Text Interpretation: Sinus rhythm Consider left ventricular hypertrophy Anterior Q waves, possibly due to LVH since last tracing no significant change Confirmed by Malvin Johns [phone]) on 12/28/2021 10:44:49 AM  Radiology DG Chest 2 View  Result Date: 12/28/2021 CLINICAL DATA:  Cough and headache. EXAM: CHEST - 2 VIEW COMPARISON:  01/22/2021 FINDINGS: The heart size and mediastinal contours are within normal limits. Both lungs are clear. The visualized skeletal structures are unremarkable. IMPRESSION: No active cardiopulmonary disease. Electronically Signed   By: Misty Stanley M.D.   On: 12/28/2021 08:47    Procedures Procedures    Medications Ordered in ED Medications  sodium chloride 0.9 % bolus 1,000 mL (0 mLs Intravenous Stopped 12/28/21 1424)    ED Course/ Medical  Decision Making/ A&P                           Medical Decision Making Amount and/or Complexity of Data Reviewed Labs: ordered. Radiology: ordered.  Risk Prescription drug management.   Patient is a 46 year old male who presents with increased thirst and blurry vision for the last 5 days.  His vital signs are stable.  His blood pressure is mildly elevated.  His glucose was over 400 which would indicate new onset diabetes.  He has no evidence of DKA on his labs.  There was  an extensive delay on getting his labs done due to the composition of his blood and it having to be diluted in centrifuged down.  He was given IV fluids which improved his glucose.  He is otherwise stable.  At this point he does not have any indication for hospitalization.  I have discussed with him management of his diabetes including diet control and we will start him on metformin.  He will need close follow-up with a PCP to have further diabetes education and glucose monitoring.  I have put in a consult to [facility] to assist with obtaining a PCP appointment.  He is also going to call and try to get an appointment with the PCP.  Strict return precautions were given.  Final Clinical Impression(s) / ED Diagnoses Final diagnoses:  Type 2 diabetes mellitus without complication, without long-term current use of insulin ([place])    Rx / DC Orders ED Discharge Orders          Ordered    metFORMIN (GLUCOPHAGE) 500 MG tablet  2 times daily with meals        12/28/21 1444              Malvin Johns, MD 12/28/21 1515

## 2021-12-29 ENCOUNTER — Telehealth: Payer: Self-pay

## 2021-12-29 NOTE — Telephone Encounter (Signed)
TOC consult follow up: RNCM spoke with patient to advise of possible PCP in his area. RNCM provided the following providers ph# : [facility] [place].  RNCM encouraged patient to call to schedule an new PCP appt to help manage his DM. Patient indicated he was also going to call social services to get some assistance as well.   No additional TOC needs.

## 2022-01-01 ENCOUNTER — Ambulatory Visit (INDEPENDENT_AMBULATORY_CARE_PROVIDER_SITE_OTHER): Payer: Self-pay | Admitting: Primary Care

## 2022-01-01 ENCOUNTER — Other Ambulatory Visit: Payer: Self-pay

## 2022-01-01 ENCOUNTER — Encounter (INDEPENDENT_AMBULATORY_CARE_PROVIDER_SITE_OTHER): Payer: Self-pay | Admitting: Primary Care

## 2022-01-01 VITALS — BP 131/93 | HR 82 | Temp 97.8°F | Ht 65.0 in | Wt 197.8 lb

## 2022-01-01 DIAGNOSIS — Z09 Encounter for follow-up examination after completed treatment for conditions other than malignant neoplasm: Secondary | ICD-10-CM

## 2022-01-01 DIAGNOSIS — Z131 Encounter for screening for diabetes mellitus: Secondary | ICD-10-CM

## 2022-01-01 DIAGNOSIS — I10 Essential (primary) hypertension: Secondary | ICD-10-CM

## 2022-01-01 DIAGNOSIS — Z6832 Body mass index (BMI) 32.0-32.9, adult: Secondary | ICD-10-CM

## 2022-01-01 DIAGNOSIS — Z7689 Persons encountering health services in other specified circumstances: Secondary | ICD-10-CM

## 2022-01-01 DIAGNOSIS — E119 Type 2 diabetes mellitus without complications: Secondary | ICD-10-CM

## 2022-01-01 LAB — POCT GLYCOSYLATED HEMOGLOBIN (HGB A1C): Hemoglobin A1C: 12.2 % — AB (ref 4.0–5.6)

## 2022-01-01 MED ORDER — LANTUS SOLOSTAR 100 UNIT/ML ~~LOC~~ SOPN
12.0000 [IU] | PEN_INJECTOR | Freq: Every day | SUBCUTANEOUS | 3 refills | Status: DC
  Filled 2022-01-01: qty 3, 25d supply, fill #0
  Filled 2022-01-22: qty 3, 25d supply, fill #1
  Filled 2022-03-03: qty 3, 25d supply, fill #2
  Filled 2022-04-03: qty 3, 25d supply, fill #3
  Filled 2022-05-07: qty 3, 25d supply, fill #4
  Filled 2022-06-25: qty 3, 25d supply, fill #0
  Filled 2022-07-30: qty 3, 25d supply, fill #1
  Filled 2022-08-31: qty 3, 25d supply, fill #2
  Filled 2022-10-16: qty 3, 25d supply, fill #3
  Filled 2022-11-11: qty 15, 125d supply, fill #4

## 2022-01-01 MED ORDER — METFORMIN HCL 1000 MG PO TABS
1000.0000 mg | ORAL_TABLET | Freq: Two times a day (BID) | ORAL | 3 refills | Status: DC
  Filled 2022-01-01: qty 60, 30d supply, fill #0
  Filled 2022-04-03: qty 60, 30d supply, fill #1
  Filled 2022-05-07: qty 60, 30d supply, fill #2

## 2022-01-01 MED ORDER — TRUE METRIX METER W/DEVICE KIT
PACK | 0 refills | Status: DC
  Filled 2022-01-01: qty 1, 365d supply, fill #0

## 2022-01-01 MED ORDER — LISINOPRIL 10 MG PO TABS
10.0000 mg | ORAL_TABLET | Freq: Every day | ORAL | 3 refills | Status: DC
  Filled 2022-01-01: qty 30, 30d supply, fill #0
  Filled 2022-02-09: qty 30, 30d supply, fill #1
  Filled 2022-04-03: qty 30, 30d supply, fill #2
  Filled 2022-05-07: qty 30, 30d supply, fill #3
  Filled 2022-06-25: qty 30, 30d supply, fill #0
  Filled 2022-08-31: qty 30, 30d supply, fill #1

## 2022-01-01 MED ORDER — TRULICITY 0.75 MG/0.5ML ~~LOC~~ SOAJ
0.7500 mg | SUBCUTANEOUS | 8 refills | Status: DC
Start: 2022-01-01 — End: 2022-05-07
  Filled 2022-01-01: qty 2, 28d supply, fill #0
  Filled 2022-04-03: qty 2, 28d supply, fill #1

## 2022-01-01 NOTE — Progress Notes (Signed)
Pt is fasting denies pain °

## 2022-01-01 NOTE — Progress Notes (Signed)
[facility]   Subjective:   Mr. Cristian White is a 46 y.o. obese male presents for Emergency room  follow up and establish care. Presented on  12/28/21,presents with blurred vision, increased thirst and a dry mouth . His blood pressure was slightly elevated.  His glucose was over 400 which would indicate new onset diabetes.  Labs indicated no evidence of DKA . He was given IV fluids which improved his glucose.  In ED he was counseled in the  management of his new dx of diabetes including diet control and we will start him on metformin.  He will need close follow-up with a PCP to have further diabetes education and glucose monitoring.  Patient was discharged on the same day and appointment scheduled to obtained PCP. Discharged on metformin. Today he has no chest pain or shortness of breath, fevers. vomiting or diarrhea. He is still experiencing polyuria and polydipsia.   History reviewed. No pertinent past medical history.   Allergies  Allergen Reactions   Diphenhydramine-Apap (Sleep) Other (See Comments)    Fainting, passes out      No current outpatient medications on file prior to visit.   No current facility-administered medications on file prior to visit.   Review of System: Comprehensive ROS Pertinent positive and negative noted in HPI    Objective:  BP (!) 131/93 (BP Location: Right Arm, Patient Position: Sitting, Cuff Size: Normal)    Pulse 82    Temp 97.8 F (36.6 C) (Oral)    Ht $R'5\' 5"'Cp$  (1.651 m)    Wt 197 lb 12.8 oz (89.7 kg)    SpO2 95%    BMI 32.92 kg/m   Filed Weights   01/01/22 0930  Weight: 197 lb 12.8 oz (89.7 kg)   Physical Exam: General Appearance: Well nourished, obese male, in no apparent distress. Eyes: PERRLA, EOMs, conjunctiva no swelling or erythema Sinuses: No Frontal/maxillary tenderness ENT/Mouth: Ext aud canals clear, TMs without erythema, bulging.  Hearing normal.  Neck: Supple, thyroid normal.  Respiratory: Respiratory effort normal, BS  equal bilaterally without rales, rhonchi, wheezing or stridor.  Cardio: RRR with no MRGs. Brisk peripheral pulses without edema.  Abdomen: Soft, + BS.  Non tender, no guarding, rebound, hernias, masses. Lymphatics: Non tender without lymphadenopathy.  Musculoskeletal: Full ROM, 5/5 strength, normal gait.  Skin: Warm, dry without rashes, lesions, ecchymosis.  Neuro: Cranial nerves intact. Normal muscle tone, no cerebellar symptoms. Sensation intact.  Psych: Awake and oriented X 3, normal affect, Insight and Judgment appropriate.   Assessment:  Jaquay was seen today for hospitalization follow-up.  Diagnoses and all orders for this visit:  Hospital discharge follow-up Retrieved from discharge AVS You need to follow-up with your primary care doctor urgently for management of your diabetes.  Screening for diabetes mellitus -     HgB A1c 12.2   Type 2 diabetes mellitus without complication, without long-term current use of insulin ([place]) ADA recommends the following therapeutic goals for glycemic control related to A1c measurements: Goal of therapy: Less than 6.5 hemoglobin A1c.  Reference clinical practice recommendations. Foods that are high in carbohydrates are the following rice, potatoes, breads, sugars, and pastas.  Reduction in the intake (eating) will assist in lowering your blood sugars.  -     Lipid Panel  Essential hypertension Counseled on blood pressure goal of less than 130/80, low-sodium, DASH diet, medication compliance, 150 minutes of moderate intensity exercise per week. Discussed medication compliance, adverse effects.  -     lisinopril (  ZESTRIL) 10 MG tablet; Take 1 tablet (10 mg total) by mouth daily.  Encounter to establish care Establish care   Class 2 severe obesity due to excess calories with serious comorbidity in adult, unspecified BMI ([place]) Obesity is 30-39 indicating an excess in caloric intake or underlining conditions. This may lead to other co-morbidities.  Lifestyle modifications of diet and exercise may reduce obesity.    Other orders -     Blood Glucose Monitoring Suppl (TRUE METRIX METER) w/Device KIT; Use up to four times daily as directed. -     metFORMIN (GLUCOPHAGE) 1000 MG tablet; Take 1 tablet (1,000 mg total) by mouth 2 (two) times daily with a meal. -     Dulaglutide (TRULICITY) 2.57 KV/3.5LE SOPN; Inject 0.75 mg into the skin once a week. -     insulin glargine (LANTUS SOLOSTAR) 100 UNIT/ML Solostar Pen; Inject 12 Units into the skin at bedtime.     Meds ordered this encounter  Medications   Blood Glucose Monitoring Suppl (TRUE METRIX METER) w/Device KIT    Sig: Use up to four times daily as directed.    Dispense:  1 kit    Refill:  0    Order Specific Question:   Number of strips    Answer:   100    Order Specific Question:   Number of lancets    Answer:   100   metFORMIN (GLUCOPHAGE) 1000 MG tablet    Sig: Take 1 tablet (1,000 mg total) by mouth 2 (two) times daily with a meal.    Dispense:  180 tablet    Refill:  3   Dulaglutide (TRULICITY) 1.74 JF/5.9BZ SOPN    Sig: Inject 0.75 mg into the skin once a week.    Dispense:  0.5 mL    Refill:  8   insulin glargine (LANTUS SOLOSTAR) 100 UNIT/ML Solostar Pen    Sig: Inject 12 Units into the skin at bedtime.    Dispense:  15 mL    Refill:  3   lisinopril (ZESTRIL) 10 MG tablet    Sig: Take 1 tablet (10 mg total) by mouth daily.    Dispense:  90 tablet    Refill:  3    This note has been created with Surveyor, quantity. Any transcriptional errors are unintentional.   Kerin Perna, NP 01/04/2022, 2:23 PM

## 2022-01-01 NOTE — Patient Instructions (Signed)

## 2022-01-02 ENCOUNTER — Telehealth (INDEPENDENT_AMBULATORY_CARE_PROVIDER_SITE_OTHER): Payer: Self-pay

## 2022-01-02 ENCOUNTER — Other Ambulatory Visit (INDEPENDENT_AMBULATORY_CARE_PROVIDER_SITE_OTHER): Payer: Self-pay | Admitting: Primary Care

## 2022-01-02 ENCOUNTER — Other Ambulatory Visit: Payer: Self-pay

## 2022-01-02 DIAGNOSIS — E785 Hyperlipidemia, unspecified: Secondary | ICD-10-CM

## 2022-01-02 DIAGNOSIS — E1169 Type 2 diabetes mellitus with other specified complication: Secondary | ICD-10-CM

## 2022-01-02 LAB — LIPID PANEL
Chol/HDL Ratio: 11.6 ratio — ABNORMAL HIGH (ref 0.0–5.0)
Cholesterol, Total: 312 mg/dL — ABNORMAL HIGH (ref 100–199)
HDL: 27 mg/dL — ABNORMAL LOW (ref >39)
Triglycerides: 989 mg/dL (ref 0–149)

## 2022-01-02 MED ORDER — ATORVASTATIN CALCIUM 80 MG PO TABS
80.0000 mg | ORAL_TABLET | Freq: Every day | ORAL | 3 refills | Status: DC
  Filled 2022-01-02: qty 90, 90d supply, fill #0
  Filled 2022-04-03: qty 90, 90d supply, fill #1
  Filled 2022-05-07: qty 90, 90d supply, fill #2

## 2022-01-02 MED ORDER — FENOFIBRATE 145 MG PO TABS: 145.0000 mg | ORAL_TABLET | Freq: Every day | ORAL | 1 refills | Status: DC

## 2022-01-02 MED ORDER — ATORVASTATIN CALCIUM 80 MG PO TABS: 80.0000 mg | ORAL_TABLET | Freq: Every day | ORAL | 3 refills | Status: DC

## 2022-01-02 MED ORDER — FENOFIBRATE 145 MG PO TABS
145.0000 mg | ORAL_TABLET | Freq: Every day | ORAL | 1 refills | Status: DC
  Filled 2022-01-02: qty 30, 30d supply, fill #0
  Filled 2022-02-09: qty 30, 30d supply, fill #1
  Filled 2022-04-03: qty 30, 30d supply, fill #2
  Filled 2022-05-07: qty 30, 30d supply, fill #3
  Filled 2022-06-25: qty 30, 30d supply, fill #0

## 2022-01-02 NOTE — Telephone Encounter (Signed)
Patient is aware of elevated cholesterol which increases risk for stroke and heart attack. Informed patient that two prescriptions have been sent to the pharmacy. Instructed to take both at bedtime. Provided dietary advise per PCP to patient. He verbalized understanding. Maryjean Morn, CMA

## 2022-01-22 ENCOUNTER — Telehealth (INDEPENDENT_AMBULATORY_CARE_PROVIDER_SITE_OTHER): Payer: Self-pay

## 2022-01-22 ENCOUNTER — Other Ambulatory Visit: Payer: Self-pay

## 2022-01-22 ENCOUNTER — Other Ambulatory Visit (INDEPENDENT_AMBULATORY_CARE_PROVIDER_SITE_OTHER): Payer: Self-pay | Admitting: Primary Care

## 2022-01-22 MED ORDER — INSULIN PEN NEEDLE 31G X 5 MM MISC: 1.0000 | Freq: Two times a day (BID) | 11 refills | Status: DC

## 2022-01-22 NOTE — Telephone Encounter (Signed)
Copied from CRM [id number]. Topic: General - Other ?>> Jan 22, 2022  1:06 PM Marylen Ponto wrote: ?Reason for CRM: Pt stated he needs a Rx for needles for the insulin pen. ?

## 2022-02-09 ENCOUNTER — Other Ambulatory Visit: Payer: Self-pay

## 2022-02-11 ENCOUNTER — Other Ambulatory Visit: Payer: Self-pay

## 2022-02-11 ENCOUNTER — Ambulatory Visit: Payer: Medicaid Other | Admitting: Family Medicine

## 2022-03-03 ENCOUNTER — Other Ambulatory Visit: Payer: Self-pay

## 2022-03-04 ENCOUNTER — Other Ambulatory Visit: Payer: Self-pay

## 2022-03-31 ENCOUNTER — Encounter (INDEPENDENT_AMBULATORY_CARE_PROVIDER_SITE_OTHER): Payer: Self-pay | Admitting: Primary Care

## 2022-03-31 ENCOUNTER — Ambulatory Visit (INDEPENDENT_AMBULATORY_CARE_PROVIDER_SITE_OTHER): Payer: Self-pay | Admitting: Primary Care

## 2022-03-31 VITALS — BP 132/87 | HR 78 | Resp 18 | Ht 65.0 in | Wt 205.0 lb

## 2022-03-31 DIAGNOSIS — E119 Type 2 diabetes mellitus without complications: Secondary | ICD-10-CM

## 2022-03-31 LAB — POCT GLYCOSYLATED HEMOGLOBIN (HGB A1C): Hemoglobin A1C: 6.9 % — AB (ref 4.0–5.6)

## 2022-03-31 NOTE — Progress Notes (Signed)
Patient takes medication at night. ?Patient has eaten today. ?Patient denies pain at this time. ? ?

## 2022-03-31 NOTE — Progress Notes (Signed)
? ?Subjective:  ?Patient ID: Cristian White, male    DOB: 18-Nov-1976  Age: 46 y.o. MRN: [id number] ? ?CC: No chief complaint on file. ? ? ?HPI ?Mr. MASSIAH MINJARES presents for follow-up of diabetes. Patient does check blood sugar at home ? ?Compliant with meds - Yes ?Checking CBGs? Yes ? Fasting avg -  ? Postprandial average -  ?Exercising regularly? - Yes ?Watching carbohydrate intake? - Yes ?Neuropathy ? - No ?Hypoglycemic events - No ? - Recovers with :  ? ?Pertinent ROS:  ?Polyuria - No ?Polydipsia - No ?Vision problems - No ? ?Medications as noted below. Taking them regularly without complication/adverse reaction being reported today.  ? ?History ?Benjamine has no past medical history on file.  ? ?He has no past surgical history on file.  ? ?His family history is not on file.He reports that he has been smoking cigarettes. He has been smoking an average of 1 pack per day. He has never used smokeless tobacco. He reports current alcohol use. He reports current drug use. Drug: Cocaine. ? ?Current Outpatient Medications on File Prior to Visit  ?Medication Sig Dispense Refill  ? atorvastatin (LIPITOR) 80 MG tablet Take 1 tablet (80 mg total) by mouth daily. 90 tablet 3  ? Blood Glucose Monitoring Suppl (TRUE METRIX METER) w/Device KIT Use up to four times daily as directed. 1 kit 0  ? Dulaglutide (TRULICITY) 6.06 TK/1.6WF SOPN Inject 0.75 mg into the skin once a week. 0.5 mL 8  ? fenofibrate (TRICOR) 145 MG tablet Take 1 tablet (145 mg total) by mouth daily. 90 tablet 1  ? insulin glargine (LANTUS SOLOSTAR) 100 UNIT/ML Solostar Pen Inject 12 Units into the skin at bedtime. 15 mL 3  ? Insulin Pen Needle 31G X 5 MM MISC 1 Bag by Does not apply route 2 (two) times daily. 100 each 11  ? lisinopril (ZESTRIL) 10 MG tablet Take 1 tablet (10 mg total) by mouth daily. 90 tablet 3  ? metFORMIN (GLUCOPHAGE) 1000 MG tablet Take 1 tablet (1,000 mg total) by mouth 2 (two) times daily with a meal. 180 tablet 3  ? ?No current  facility-administered medications on file prior to visit.  ? ? ?ROS ?Comprehensive ROS Pertinent positive and negative noted in HPI   ? ?Objective:  ?BP 132/87 (BP Location: Right Arm, Patient Position: Sitting, Cuff Size: Normal)   Pulse 78   Resp 18   Ht $R'5\' 5"'RD$  (1.651 m)   Wt 205 lb (93 kg)   SpO2 97%   BMI 34.11 kg/m?  ? ?BP Readings from Last 3 Encounters:  ?03/31/22 132/87  ?01/01/22 (!) 131/93  ?12/28/21 (!) 139/98  ? ? ?Wt Readings from Last 3 Encounters:  ?03/31/22 205 lb (93 kg)  ?01/01/22 197 lb 12.8 oz (89.7 kg)  ?01/23/21 193 lb 5.5 oz (87.7 kg)  ? ? ?Physical Exam ?Vitals reviewed.  ?Constitutional:   ?   Appearance: He is obese.  ?HENT:  ?   Head: Normocephalic.  ?   Right Ear: Tympanic membrane and external ear normal.  ?   Left Ear: Tympanic membrane and external ear normal.  ?   Nose: Nose normal.  ?Cardiovascular:  ?   Rate and Rhythm: Normal rate and regular rhythm.  ?Pulmonary:  ?   Effort: Pulmonary effort is normal.  ?   Breath sounds: Normal breath sounds.  ?Abdominal:  ?   General: Bowel sounds are normal.  ?Musculoskeletal:     ?   General: Normal  range of motion.  ?   Cervical back: Normal range of motion and neck supple.  ?Skin: ?   General: Skin is warm and dry.  ?Neurological:  ?   Mental Status: He is alert and oriented to person, place, and time.  ?Psychiatric:     ?   Mood and Affect: Mood normal.     ?   Behavior: Behavior normal.     ?   Thought Content: Thought content normal.     ?   Judgment: Judgment normal.  ? ?Lab Results  ?Component Value Date  ? HGBA1C 6.9 (A) 03/31/2022  ? HGBA1C 12.2 (A) 01/01/2022  ? ? ?Lab Results  ?Component Value Date  ? WBC 6.6 12/28/2021  ? HGB 16.5 12/28/2021  ? HCT 44.1 12/28/2021  ? PLT 185 12/28/2021  ? GLUCOSE 452 (H) 12/28/2021  ? CHOL 312 (H) 01/01/2022  ? TRIG 989 (HH) 01/01/2022  ? HDL 27 (L) 01/01/2022  ? [place] Comment (A) 01/01/2022  ? ALT 37 12/28/2021  ? AST 27 12/28/2021  ? NA 132 (L) 12/28/2021  ? K 4.6 12/28/2021  ? CL 94 (L)  12/28/2021  ? CREATININE 0.94 12/28/2021  ? BUN 13 12/28/2021  ? CO2 24 12/28/2021  ? HGBA1C 6.9 (A) 03/31/2022  ? ? ? ?Assessment & Plan:  ? ?Diagnoses and all orders for this visit: ? ?Type 2 diabetes mellitus without complication, without long-term current use of insulin ([place]) ?-     HgB A1c 6.9. Vast improvement from 12.2 . He admits to taking medication as directed , exercising  and monitoring foods that are high in carbohydrates are the following rice, potatoes, breads, sugars, and pastas.  Reduction in the intake (eating) will assist in lowering your blood sugars.  ? ? ?I am having Vedia Pereyra. Sawyer maintain his True Metrix Meter, metFORMIN, Trulicity, Lantus SoloStar, lisinopril, atorvastatin, fenofibrate, and Insulin Pen Needle. ? ?No orders of the defined types were placed in this encounter. ? ? ? ?Follow-up:  ? ?Return for medical conditions. ? ?The above assessment and management plan was discussed with the patient. The patient verbalized understanding of and has agreed to the management plan. Patient is aware to call the clinic if symptoms fail to improve or worsen. Patient is aware when to return to the clinic for a follow-up visit. Patient educated on when it is appropriate to go to the emergency department.  ? ?Juluis Mire, NP-C ? ?  ?

## 2022-04-01 ENCOUNTER — Other Ambulatory Visit: Payer: Self-pay

## 2022-04-03 ENCOUNTER — Other Ambulatory Visit: Payer: Self-pay

## 2022-05-01 ENCOUNTER — Other Ambulatory Visit: Payer: Self-pay

## 2022-05-07 ENCOUNTER — Other Ambulatory Visit: Payer: Self-pay

## 2022-05-07 ENCOUNTER — Other Ambulatory Visit (INDEPENDENT_AMBULATORY_CARE_PROVIDER_SITE_OTHER): Payer: Self-pay | Admitting: Primary Care

## 2022-05-07 NOTE — Telephone Encounter (Signed)
Routed to PCP 

## 2022-05-11 ENCOUNTER — Other Ambulatory Visit: Payer: Self-pay

## 2022-05-11 MED ORDER — TRULICITY 0.75 MG/0.5ML ~~LOC~~ SOAJ
0.7500 mg | SUBCUTANEOUS | 8 refills | Status: DC
  Filled 2022-05-11: qty 2, 28d supply, fill #0

## 2022-05-12 ENCOUNTER — Other Ambulatory Visit: Payer: Self-pay

## 2022-05-13 ENCOUNTER — Emergency Department (HOSPITAL_COMMUNITY)
Admission: EM | Admit: 2022-05-13 | Discharge: 2022-05-13 | Disposition: A | Discharge status: Home / Self Care | Payer: Self-pay | Attending: Emergency Medicine | Admitting: Emergency Medicine

## 2022-05-13 ENCOUNTER — Encounter (HOSPITAL_COMMUNITY): Payer: Self-pay

## 2022-05-13 ENCOUNTER — Emergency Department (HOSPITAL_COMMUNITY): Payer: Self-pay

## 2022-05-13 ENCOUNTER — Other Ambulatory Visit: Payer: Self-pay

## 2022-05-13 DIAGNOSIS — E119 Type 2 diabetes mellitus without complications: Secondary | ICD-10-CM | POA: Insufficient documentation

## 2022-05-13 DIAGNOSIS — Z7984 Long term (current) use of oral hypoglycemic drugs: Secondary | ICD-10-CM | POA: Insufficient documentation

## 2022-05-13 DIAGNOSIS — K122 Cellulitis and abscess of mouth: Secondary | ICD-10-CM | POA: Insufficient documentation

## 2022-05-13 DIAGNOSIS — Z794 Long term (current) use of insulin: Secondary | ICD-10-CM | POA: Insufficient documentation

## 2022-05-13 DIAGNOSIS — K047 Periapical abscess without sinus: Secondary | ICD-10-CM | POA: Insufficient documentation

## 2022-05-13 LAB — CBC WITH DIFFERENTIAL/PLATELET
Abs Immature Granulocytes: 0.03 10*3/uL (ref 0.00–0.07)
Basophils Absolute: 0.1 10*3/uL (ref 0.0–0.1)
Basophils Relative: 1 %
Eosinophils Absolute: 0.1 10*3/uL (ref 0.0–0.5)
Eosinophils Relative: 1 %
HCT: 47 % (ref 39.0–52.0)
Hemoglobin: 15.7 g/dL (ref 13.0–17.0)
Immature Granulocytes: 0 %
Lymphocytes Relative: 32 %
Lymphs Abs: 3 10*3/uL (ref 0.7–4.0)
MCH: 29.7 pg (ref 26.0–34.0)
MCHC: 33.4 g/dL (ref 30.0–36.0)
MCV: 88.8 fL (ref 80.0–100.0)
Monocytes Absolute: 0.7 10*3/uL (ref 0.1–1.0)
Monocytes Relative: 8 %
Neutro Abs: 5.5 10*3/uL (ref 1.7–7.7)
Neutrophils Relative %: 58 %
Platelets: 253 10*3/uL (ref 150–400)
RBC: 5.29 MIL/uL (ref 4.22–5.81)
RDW: 13 % (ref 11.5–15.5)
WBC: 9.4 10*3/uL (ref 4.0–10.5)
nRBC: 0 % (ref 0.0–0.2)

## 2022-05-13 LAB — BASIC METABOLIC PANEL
Anion gap: 12 (ref 5–15)
BUN: 10 mg/dL (ref 6–20)
CO2: 22 mmol/L (ref 22–32)
Calcium: 10 mg/dL (ref 8.9–10.3)
Chloride: 106 mmol/L (ref 98–111)
Creatinine, Ser: 0.98 mg/dL (ref 0.61–1.24)
GFR, Estimated: >60 mL/min (ref >60)
Glucose, Bld: 120 mg/dL — ABNORMAL HIGH (ref 70–99)
Potassium: 4.1 mmol/L (ref 3.5–5.1)
Sodium: 140 mmol/L (ref 135–145)

## 2022-05-13 MED ORDER — AMOXICILLIN-POT CLAVULANATE 875-125 MG PO TABS: 1.0000 | ORAL_TABLET | Freq: Two times a day (BID) | ORAL | 0 refills | Status: AC

## 2022-05-13 MED ORDER — IOHEXOL 300 MG/ML  SOLN
75.0000 mL | Freq: Once | INTRAMUSCULAR | Status: AC | PRN
  Administered 2022-05-13: 75 mL via INTRAVENOUS

## 2022-05-13 NOTE — ED Provider Notes (Cosign Needed)
[facility] DEPT Provider Note   CSN: [id number] Arrival date & time: 05/13/22  9937     History  Chief Complaint  Patient presents with   Abscess    Cristian White is a 46 y.o. male.  Patient presents with facial swelling to the left side of his face.  Patient denies any pain at this time.  States there is some mild discomfort.  Swelling has been in place for approximately 3 to 4 days.  Patient feels that the swelling may be slightly less severe today than it was over the past few days.  Patient denies any dental pain, fevers, abdominal pain, nausea, vomiting, or recent injury.  Past medical history significant for diabetes, high cholesterol  HPI     Home Medications Prior to Admission medications   Medication Sig Start Date End Date Taking? Authorizing Provider  amoxicillin-clavulanate (AUGMENTIN) 875-125 MG tablet Take 1 tablet by mouth every 12 (twelve) hours for 10 days. 05/13/22 05/23/22 Yes Dorothyann Peng, PA-C  atorvastatin (LIPITOR) 80 MG tablet Take 1 tablet (80 mg total) by mouth daily. 01/02/22   Kerin Perna, NP  Blood Glucose Monitoring Suppl (TRUE METRIX METER) w/Device KIT Use up to four times daily as directed. 01/01/22   Kerin Perna, NP  Dulaglutide (TRULICITY) 1.69 CV/8.9FY SOPN Inject 0.75 mg into the skin once a week. 05/11/22   Kerin Perna, NP  fenofibrate (TRICOR) 145 MG tablet Take 1 tablet (145 mg total) by mouth daily. 01/02/22   Kerin Perna, NP  insulin glargine (LANTUS SOLOSTAR) 100 UNIT/ML Solostar Pen Inject 12 Units into the skin at bedtime. 01/01/22   Kerin Perna, NP  Insulin Pen Needle 31G X 5 MM MISC 1 Bag by Does not apply route 2 (two) times daily. 01/22/22   Kerin Perna, NP  lisinopril (ZESTRIL) 10 MG tablet Take 1 tablet (10 mg total) by mouth daily. 01/01/22   Kerin Perna, NP  metFORMIN (GLUCOPHAGE) 1000 MG tablet Take 1 tablet (1,000 mg total) by mouth 2 (two) times daily  with a meal. 01/01/22   Kerin Perna, NP      Allergies    Diphenhydramine-apap (sleep)    Review of Systems   Review of Systems  Constitutional:  Negative for fever.  HENT:  Positive for facial swelling. Negative for dental problem, sinus pain, trouble swallowing and voice change.   Respiratory:  Negative for shortness of breath.   Cardiovascular:  Negative for chest pain.  Gastrointestinal:  Negative for abdominal pain, nausea and vomiting.    Physical Exam Updated Vital Signs BP (!) 134/98 (BP Location: Right Arm)   Pulse 94   Temp 98.8 F (37.1 C) (Oral)   Resp 18   Ht $R'5\' 5"'HU$  (1.651 m)   Wt 81.6 kg   SpO2 97%   BMI 29.95 kg/m  Physical Exam Vitals and nursing note reviewed.  Constitutional:      General: He is not in acute distress. HENT:     Head: Normocephalic.      Mouth/Throat:     Mouth: Mucous membranes are moist.     Comments: Poor dentition noted at lower left molars/premolars. Mild erythema. No significant swelling noted. No tenderness to pressure at teeth Eyes:     Conjunctiva/sclera: Conjunctivae normal.  Cardiovascular:     Rate and Rhythm: Normal rate and regular rhythm.     Heart sounds: Normal heart sounds.  Pulmonary:     Effort: Pulmonary  effort is normal.     Breath sounds: Normal breath sounds.  Musculoskeletal:     Cervical back: Normal range of motion and neck supple.  Skin:    General: Skin is warm and dry.  Neurological:     Mental Status: He is alert.     ED Results / Procedures / Treatments   Labs (all labs ordered are listed, but only abnormal results are displayed) Labs Reviewed  BASIC METABOLIC PANEL - Abnormal; Notable for the following components:      Result Value   Glucose, Bld 120 (*)    All other components within normal limits  CBC WITH DIFFERENTIAL/PLATELET    EKG None  Radiology CT Maxillofacial W Contrast  Result Date: 05/13/2022 CLINICAL DATA:  Left mouth pain and swelling beginning a few days ago  EXAM: CT MAXILLOFACIAL WITH CONTRAST TECHNIQUE: Multidetector CT imaging of the maxillofacial structures was performed with intravenous contrast. Multiplanar CT image reconstructions were also generated. RADIATION DOSE REDUCTION: This exam was performed according to the departmental dose-optimization program which includes automated exposure control, adjustment of the mA and/or kV according to patient size and/or use of iterative reconstruction technique. CONTRAST:  10mL OMNIPAQUE IOHEXOL 300 MG/ML  SOLN COMPARISON:  CT head and cervical spine 01/22/2021 maxillofacial CT 11/11/2013 FINDINGS: Osseous: There is no acute facial bone fracture. There is no suspicious osseous lesion. There is marked periapical lucency around the roots of the left maxillary molars and to a lesser degree around the roots of the left maxillary central incisor and premolars, and right maxillary first premolar and first molar. There are defects along the medial and lateral margins of the maxillary cortex Periapical lucencies are also seen involving multiple mandibular teeth, most notably involving the left mandibular canine and first premolar, and first molar. There is breakthrough along the buccal margin of the left mandibular cortex around the root of the left mandibular first molar (7-32, 4-76). There is a small area of hypodensity with faint peripheral enhancement in the buccal soft tissues suspicious for early abscess (7-33, 34, 3-77). There is possible mild stranding in the overlying subcutaneous fat. There is also breakthrough of the buccal cortex along the right mandibular second premolar (4-79). Orbits: The globes and orbits are unremarkable. Sinuses: There is mucosal thickening in the left maxillary sinus, probably odontogenic in nature. Soft tissues: There are prominent level I and II lymph nodes, more so on the left measuring up to 9 mm in short axis, nonspecific but favored reactive. 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 Limited intracranial: The imaged portions of the intracranial compartment are unremarkable. IMPRESSION: 1. Extensive dental disease described in detail above. Specifically, there is significant periapical lucency around the root of the left mandibular first molar with a suspected early/developing abscess along the buccal margin of the mandible. Correlate with history and physical exam. 2. Prominent level I and II lymph nodes are nonspecific but favored reactive. 3. Perforated nasal septum, unchanged. Electronically Signed   By: Valetta Mole M.D.   On: 05/13/2022 11:17    Procedures Procedures    Medications Ordered in ED Medications  iohexol (OMNIPAQUE) 300 MG/ML solution 75 mL (75 mLs Intravenous Contrast Given 05/13/22 1045)    ED Course/ Medical Decision Making/ A&P                           Medical Decision Making Amount and/or Complexity of Data Reviewed Labs: ordered. Radiology: ordered.  Risk Prescription drug management.   This patient presents to the ED for concern of facial swelling, this involves an extensive number of treatment options, and is a complaint that carries with it a high risk of complications and morbidity.  The differential diagnosis includes but is not limited to buccal space infections, masticator space infections, dental problems, cellulitis, peritonsillar abscess   Lab Tests:  I Ordered, and personally interpreted labs.  The pertinent results include:  Glucose 120, grossly normal CBC   Imaging Studies ordered:  I ordered imaging studies including CT maxillofacial with contrast  I independently visualized and interpreted imaging which showed  1. Extensive dental disease described in detail above. Specifically, there is significant periapical lucency around the root of the left mandibular first molar with a suspected early/developing abscess along the buccal margin of the mandible. Correlate with history and physical exam. 2.  Prominent level I and II lymph nodes are nonspecific but favored reactive. 3. Perforated nasal septum, unchanged.   I agree with the radiologist interpretation   Test / Admission - Considered:  The patient has minimal pain at this time.  Imaging shows signs of early developing abscess along the buccal margin of the mandible. Combined with patient's poor dentition plan on discharging on augmentin with dentistry follow up. Patient voices understanding and agreement with plan       Final Clinical Impression(s) / ED Diagnoses Final diagnoses:  Dental abscess  Abscess of buccal space of mouth    Rx / DC Orders ED Discharge Orders          Ordered    amoxicillin-clavulanate (AUGMENTIN) 875-125 MG tablet  Every 12 hours        05/13/22 1212              Dorothyann Peng, PA-C 05/13/22 1213

## 2022-05-13 NOTE — ED Triage Notes (Addendum)
Pt reports left mouth pain and swelling that began a few days ago.

## 2022-05-13 NOTE — Discharge Instructions (Addendum)
You were diagnosed today with an abscess in the mandibular/buccal area thought to be from dental disease. I have prescribed an antibiotic. I recommend follow up urgently with dentistry for further evaluation and management.

## 2022-05-27 ENCOUNTER — Ambulatory Visit (INDEPENDENT_AMBULATORY_CARE_PROVIDER_SITE_OTHER): Payer: Self-pay | Admitting: *Deleted

## 2022-05-27 NOTE — Telephone Encounter (Signed)
  Chief Complaint: moved to [place] Symptoms: needs rx transferred Frequency: once Pertinent Negatives: Patient denies na Disposition: [] ED /[] Urgent Care (no appt availability in office) / [] Appointment(In office/virtual)/ []  [facility]/ [] Home Care/ [] Refused Recommended Disposition /[] [facility]/ []  Follow-up with PCP Additional Notes: Advised pt to notify his new pharm to have rx transferred over. I will notify pharmacy here. Pt has not found md yet.   Answer Assessment - Initial Assessment Questions 1. REASON FOR CALL or QUESTION: "What is your reason for calling today?" or "How can I best help you?" or "What question do you have that I can help answer?"     What do I do to get rx.  Protocols used: Information Only Call - No Triage-A-AH

## 2022-05-27 NOTE — Telephone Encounter (Signed)
Summary: discuss medication refill   Patient states he has relocated to [facility] and inquiring how he can continue receiving his medication   Please fu w/ patient      No answer, message left.

## 2022-05-29 NOTE — Telephone Encounter (Signed)
Patient aware that he will need to obtain a pharmacy in [place] and have them contact [facility] pharmacy to request medications be transferred. Maryjean Morn, CMA

## 2022-06-08 ENCOUNTER — Other Ambulatory Visit: Payer: Self-pay

## 2022-06-09 ENCOUNTER — Other Ambulatory Visit: Payer: Self-pay

## 2022-06-24 ENCOUNTER — Other Ambulatory Visit: Payer: Self-pay

## 2022-06-25 ENCOUNTER — Other Ambulatory Visit: Payer: Self-pay

## 2022-06-25 MED ORDER — FENOFIBRATE 160 MG PO TABS
ORAL_TABLET | ORAL | 1 refills | Status: DC
  Filled 2022-06-25: qty 30, 30d supply, fill #0
  Filled 2022-08-31: qty 30, 30d supply, fill #1

## 2022-06-25 MED ORDER — TRULICITY 0.75 MG/0.5ML ~~LOC~~ SOAJ
SUBCUTANEOUS | 1 refills | Status: DC
  Filled 2022-06-25: qty 2, 28d supply, fill #0
  Filled 2022-07-30: qty 2, 28d supply, fill #1

## 2022-07-03 ENCOUNTER — Ambulatory Visit (INDEPENDENT_AMBULATORY_CARE_PROVIDER_SITE_OTHER): Payer: Self-pay | Admitting: Primary Care

## 2022-07-30 ENCOUNTER — Other Ambulatory Visit: Payer: Self-pay

## 2022-08-31 ENCOUNTER — Other Ambulatory Visit: Payer: Self-pay

## 2022-08-31 ENCOUNTER — Other Ambulatory Visit (INDEPENDENT_AMBULATORY_CARE_PROVIDER_SITE_OTHER): Payer: Self-pay | Admitting: Primary Care

## 2022-08-31 NOTE — Telephone Encounter (Signed)
Will forward to provider  

## 2022-09-01 ENCOUNTER — Other Ambulatory Visit: Payer: Self-pay

## 2022-09-01 ENCOUNTER — Other Ambulatory Visit (INDEPENDENT_AMBULATORY_CARE_PROVIDER_SITE_OTHER): Payer: Self-pay | Admitting: Primary Care

## 2022-09-01 MED ORDER — TRULICITY 0.75 MG/0.5ML ~~LOC~~ SOAJ
SUBCUTANEOUS | 1 refills | Status: DC
  Filled 2022-09-01: qty 2, 28d supply, fill #0
  Filled 2022-10-16: qty 2, 28d supply, fill #1

## 2022-09-01 NOTE — Telephone Encounter (Signed)
Requested Prescriptions  Pending Prescriptions Disp Refills  . Dulaglutide (TRULICITY) 8.14 GY/1.8HU SOPN 2 mL 1    Sig: inject 0.75mg  subcutanously once a week     Endocrinology:  Diabetes - GLP-1 Receptor Agonists Passed - 09/01/2022  9:20 AM      Passed - HBA1C is between 0 and 7.9 and within 180 days    Hemoglobin A1C  Date Value Ref Range Status  03/31/2022 6.9 (A) 4.0 - 5.6 % Final         Passed - Valid encounter within last 6 months    Recent Outpatient Visits          5 months ago Type 2 diabetes mellitus without complication, without long-term current use of insulin ([place])   [facility] Kerin Perna, NP   8 months ago Hospital discharge follow-up   Parks, Michelle P, NP

## 2022-10-16 ENCOUNTER — Other Ambulatory Visit: Payer: Self-pay

## 2022-10-16 ENCOUNTER — Other Ambulatory Visit (HOSPITAL_COMMUNITY): Payer: Self-pay

## 2022-11-11 ENCOUNTER — Other Ambulatory Visit (INDEPENDENT_AMBULATORY_CARE_PROVIDER_SITE_OTHER): Payer: Self-pay | Admitting: Primary Care

## 2022-11-11 ENCOUNTER — Other Ambulatory Visit: Payer: Self-pay

## 2022-11-11 MED ORDER — INSULIN PEN NEEDLE 31G X 5 MM MISC
1.0000 | Freq: Two times a day (BID) | 11 refills | Status: DC
  Filled 2022-11-11: qty 100, 25d supply, fill #0

## 2022-11-11 NOTE — Telephone Encounter (Signed)
Requested medication (s) are due for refill today - yes  Requested medication (s) are on the active medication list -yes  Future visit scheduled -no  Last refill: 09/01/22 32ml  1RF  Notes to clinic: Attempted to call both numbers listed for patient- no answer- unable to leave message- last RF has notes- sent for provider review   Requested Prescriptions  Pending Prescriptions Disp Refills   Dulaglutide (TRULICITY) 0.75 MG/0.5ML SOPN 2 mL 1    Sig: inject 0.75mg  subcutanously once a week     Endocrinology:  Diabetes - GLP-1 Receptor Agonists Failed - 11/11/2022  8:07 AM      Failed - HBA1C is between 0 and 7.9 and within 180 days    Hemoglobin A1C  Date Value Ref Range Status  03/31/2022 6.9 (A) 4.0 - 5.6 % Final         Failed - Valid encounter within last 6 months    Recent Outpatient Visits           7 months ago Type 2 diabetes mellitus without complication, without long-term current use of insulin (HCC)   [facility] Grayce Sessions, NP   10 months ago Hospital discharge follow-up   [facility] Grayce Sessions, NP                 Requested Prescriptions  Pending Prescriptions Disp Refills   Dulaglutide (TRULICITY) 0.75 MG/0.5ML SOPN 2 mL 1    Sig: inject 0.75mg  subcutanously once a week     Endocrinology:  Diabetes - GLP-1 Receptor Agonists Failed - 11/11/2022  8:07 AM      Failed - HBA1C is between 0 and 7.9 and within 180 days    Hemoglobin A1C  Date Value Ref Range Status  03/31/2022 6.9 (A) 4.0 - 5.6 % Final         Failed - Valid encounter within last 6 months    Recent Outpatient Visits           7 months ago Type 2 diabetes mellitus without complication, without long-term current use of insulin (HCC)   [facility] Grayce Sessions, NP   10 months ago Hospital discharge follow-up   [facility] Grayce Sessions, NP                r

## 2022-11-25 ENCOUNTER — Other Ambulatory Visit (INDEPENDENT_AMBULATORY_CARE_PROVIDER_SITE_OTHER): Payer: Self-pay | Admitting: Primary Care

## 2022-11-25 ENCOUNTER — Other Ambulatory Visit: Payer: Self-pay

## 2022-11-26 NOTE — Telephone Encounter (Signed)
Requested medication (s) are due for refill today: routing for review  Requested medication (s) are on the active medication list: yes  Last refill:  09/01/22  Future visit scheduled: no  Notes to clinic:  Unable to refill per protocol, courtesy refill already given, routing for provider approval.      Requested Prescriptions  Pending Prescriptions Disp Refills   Dulaglutide (TRULICITY) 7.06 CB/7.6EG SOPN 2 mL 1    Sig: inject 0.75mg  subcutanously once a week     Endocrinology:  Diabetes - GLP-1 Receptor Agonists Failed - 11/25/2022 10:23 AM      Failed - HBA1C is between 0 and 7.9 and within 180 days    Hemoglobin A1C  Date Value Ref Range Status  03/31/2022 6.9 (A) 4.0 - 5.6 % Final         Failed - Valid encounter within last 6 months    Recent Outpatient Visits           8 months ago Type 2 diabetes mellitus without complication, without long-term current use of insulin ([place])   [place], Michelle P, NP   10 months ago Hospital discharge follow-up   [place], Michelle P, NP

## 2022-11-30 ENCOUNTER — Other Ambulatory Visit: Payer: Self-pay

## 2022-12-30 ENCOUNTER — Other Ambulatory Visit: Payer: Self-pay

## 2023-01-01 ENCOUNTER — Other Ambulatory Visit: Payer: Self-pay

## 2023-01-19 ENCOUNTER — Other Ambulatory Visit: Payer: Self-pay

## 2023-01-19 ENCOUNTER — Other Ambulatory Visit (INDEPENDENT_AMBULATORY_CARE_PROVIDER_SITE_OTHER): Payer: Self-pay | Admitting: Primary Care

## 2023-01-20 NOTE — Telephone Encounter (Signed)
Called pt but unable to leave VM.  Last RF Trulicity: [id number] 2 ml 1 RF  Last RF Lantus: 01/01/22 15 ml 3 RF Overdue lab and appt.  Requested Prescriptions  Pending Prescriptions Disp Refills   insulin glargine (LANTUS SOLOSTAR) 100 UNIT/ML Solostar Pen 15 mL 3    Sig: Inject 12 Units into the skin at bedtime.     Endocrinology:  Diabetes - Insulins Failed - 01/19/2023  5:04 PM      Failed - HBA1C is between 0 and 7.9 and within 180 days    Hemoglobin A1C  Date Value Ref Range Status  03/31/2022 6.9 (A) 4.0 - 5.6 % Final         Failed - Valid encounter within last 6 months    Recent Outpatient Visits           9 months ago Type 2 diabetes mellitus without complication, without long-term current use of insulin ([place])   [place] [facility] Kerin Perna, NP   1 year ago Hospital discharge follow-up   [place] Kerin Perna, NP               Dulaglutide (TRULICITY) A999333 0000000 SOPN 2 mL 1    Sig: inject 0.'75mg'$  subcutanously once a week     Endocrinology:  Diabetes - GLP-1 Receptor Agonists Failed - 01/19/2023  5:04 PM      Failed - HBA1C is between 0 and 7.9 and within 180 days    Hemoglobin A1C  Date Value Ref Range Status  03/31/2022 6.9 (A) 4.0 - 5.6 % Final         Failed - Valid encounter within last 6 months    Recent Outpatient Visits           9 months ago Type 2 diabetes mellitus without complication, without long-term current use of insulin [facility])   [place] [facility] Kerin Perna, NP   1 year ago Hospital discharge follow-up   [place], Michelle P, NP

## 2023-01-25 ENCOUNTER — Other Ambulatory Visit: Payer: Self-pay

## 2023-05-31 ENCOUNTER — Other Ambulatory Visit: Payer: Self-pay

## 2024-04-08 ENCOUNTER — Other Ambulatory Visit: Payer: Self-pay

## 2024-04-08 ENCOUNTER — Emergency Department (HOSPITAL_COMMUNITY)

## 2024-04-08 ENCOUNTER — Encounter (HOSPITAL_COMMUNITY): Payer: Self-pay

## 2024-04-08 ENCOUNTER — Emergency Department (HOSPITAL_COMMUNITY)
Admission: EM | Admit: 2024-04-08 | Discharge: 2024-04-08 | Disposition: A | Discharge status: Home / Self Care | Attending: Emergency Medicine | Admitting: Emergency Medicine

## 2024-04-08 DIAGNOSIS — J029 Acute pharyngitis, unspecified: Secondary | ICD-10-CM | POA: Diagnosis not present

## 2024-04-08 DIAGNOSIS — R070 Pain in throat: Secondary | ICD-10-CM | POA: Diagnosis present

## 2024-04-08 HISTORY — DX: Hyperlipidemia, unspecified: E78.5

## 2024-04-08 HISTORY — DX: Type 2 diabetes mellitus without complications: E11.9

## 2024-04-08 HISTORY — DX: Essential (primary) hypertension: I10

## 2024-04-08 LAB — BASIC METABOLIC PANEL WITH GFR
Anion gap: 9 (ref 5–15)
BUN: 10 mg/dL (ref 6–20)
CO2: 24 mmol/L (ref 22–32)
Calcium: 9.4 mg/dL (ref 8.9–10.3)
Chloride: 103 mmol/L (ref 98–111)
Creatinine, Ser: 0.96 mg/dL (ref 0.61–1.24)
GFR, Estimated: >60 mL/min (ref >60)
Glucose, Bld: 96 mg/dL (ref 70–99)
Potassium: 4.1 mmol/L (ref 3.5–5.1)
Sodium: 136 mmol/L (ref 135–145)

## 2024-04-08 MED ORDER — KETOROLAC TROMETHAMINE 60 MG/2ML IM SOLN: 30.0000 mg | Freq: Once | INTRAMUSCULAR | Status: DC

## 2024-04-08 MED ORDER — LIDOCAINE VISCOUS HCL 2 % MT SOLN
15.0000 mL | Freq: Once | OROMUCOSAL | Status: AC
  Administered 2024-04-08: 15 mL via OROMUCOSAL
  Filled 2024-04-08: qty 15

## 2024-04-08 MED ORDER — LIDOCAINE VISCOUS HCL 2 % MT SOLN: 15.0000 mL | Freq: Four times a day (QID) | OROMUCOSAL | 0 refills | Status: DC | PRN

## 2024-04-08 MED ORDER — IOHEXOL 300 MG/ML  SOLN
75.0000 mL | Freq: Once | INTRAMUSCULAR | Status: AC | PRN
  Administered 2024-04-08: 75 mL via INTRAVENOUS

## 2024-04-08 MED ORDER — KETOROLAC TROMETHAMINE 15 MG/ML IJ SOLN
15.0000 mg | Freq: Once | INTRAMUSCULAR | Status: AC
  Administered 2024-04-08: 15 mg via INTRAVENOUS
  Filled 2024-04-08: qty 1

## 2024-04-08 NOTE — ED Provider Notes (Signed)
 [place] EMERGENCY DEPARTMENT AT [facility] Provider Note   CSN: [id number] Arrival date & time: 04/08/24  0503     History {Add pertinent medical, surgical, social history, OB history to HPI:1} Chief Complaint  Patient presents with   Bone in Throat    Cristian White is a 48 y.o. male.  48 year old male who presents ER today with concern of a bone struck his throat.  Patient states that he felt like he had a chicken bone stuck in his upper throat started a couple weeks ago.  Patient states that it seemed to be worse when he would go smoke a cigarette.  He states that yesterday morning when he woke up it felt like it had moved and was now closer to his sternal notch.  No cough no blood.  No vomiting.  No nausea.  No trouble swallowing.  No other associated symptoms.  Of note, patient also was playing around with his kids today and fell on his left shoulder and has tenderness over his left AC joint.  Pain with range of motion as well.         Home Medications Prior to Admission medications   Medication Sig Start Date End Date Taking? Authorizing Provider  atorvastatin  (LIPITOR) 80 MG tablet Take 1 tablet (80 mg total) by mouth daily. 01/02/22   Marius Siemens, NP  Blood Glucose Monitoring Suppl (TRUE METRIX METER) w/Device KIT Use up to four times daily as directed. 01/01/22   Marius Siemens, NP  Dulaglutide  (TRULICITY ) 0.75 MG/0.5ML SOPN inject 0.75mg  subcutanously once a week 09/01/22   Marius Siemens, NP  fenofibrate  160 MG tablet take 1 tablet by mouth daily 01/02/22     insulin  glargine (LANTUS  SOLOSTAR) 100 UNIT/ML Solostar Pen Inject 12 Units into the skin at bedtime. 01/01/22   Marius Siemens, NP  Insulin  Pen Needle 31G X 5 MM MISC Use 2 (two) times daily. 11/11/22   Marius Siemens, NP  lisinopril  (ZESTRIL ) 10 MG tablet Take 1 tablet (10 mg total) by mouth daily. 01/01/22   Marius Siemens, NP  metFORMIN  (GLUCOPHAGE ) 1000 MG tablet Take 1  tablet (1,000 mg total) by mouth 2 (two) times daily with a meal. 01/01/22   Marius Siemens, NP      Allergies    Diphenhydramine-apap (sleep)    Review of Systems   Review of Systems  Physical Exam Updated Vital Signs BP 113/84 (BP Location: Left Arm)   Pulse 90   Temp 98.9 F (37.2 C) (Oral)   Resp 16   Ht 5\' 5"  (1.651 m)   Wt 81.6 kg   SpO2 96%   BMI 29.95 kg/m  Physical Exam Vitals and nursing note reviewed.  Constitutional:      Appearance: He is well-developed.  HENT:     Head: Normocephalic and atraumatic.  Eyes:     Pupils: Pupils are equal, round, and reactive to light.  Cardiovascular:     Rate and Rhythm: Normal rate.  Pulmonary:     Effort: Pulmonary effort is normal. No respiratory distress.  Abdominal:     General: There is no distension.  Musculoskeletal:        General: Tenderness (Over left AC joint.  No obvious deformities.) present. Normal range of motion.     Cervical back: Normal range of motion.  Skin:    General: Skin is warm and dry.  Neurological:     General: No focal deficit present.  Mental Status: He is alert.     ED Results / Procedures / Treatments   Labs (all labs ordered are listed, but only abnormal results are displayed) Labs Reviewed - No data to display  EKG None  Radiology No results found.  Procedures Procedures    Medications Ordered in ED Medications  lidocaine  (XYLOCAINE ) 2 % viscous mouth solution 15 mL (has no administration in time range)  ketorolac (TORADOL) 15 MG/ML injection 15 mg (has no administration in time range)    ED Course/ Medical Decision Making/ A&P                                 Medical Decision Making Amount and/or Complexity of Data Reviewed Radiology: ordered.  Risk Prescription drug management.  On presentation for fishbone stuck in her esophagus.  Going on for 2 weeks without any swallowing difficulties only worse when he smokes a cigarette makes me wonder if it might  be in his trachea although he was not coughing or taking a deep breath when he felt like he got stuck.  Will start with an x-ray of his neck along with a shoulder x-ray for a can of AC joint separation or fracture.  Need to move onto CT scan if these are negative.  Tolerate secretions.  Vital signs normal.  Lidocaine  Toradol ordered. ***  {Document critical care time when appropriate:1} {Document review of labs and clinical decision tools ie heart score, Chads2Vasc2 etc:1}  {Document your independent review of radiology images, and any outside records:1} {Document your discussion with family members, caretakers, and with consultants:1} {Document social determinants of health affecting pt's care:1} {Document your decision making why or why not admission, treatments were needed:1} Final Clinical Impression(s) / ED Diagnoses Final diagnoses:  None    Rx / DC Orders ED Discharge Orders     None

## 2024-04-08 NOTE — ED Notes (Signed)
 Patient transported to CT

## 2024-04-08 NOTE — ED Triage Notes (Addendum)
 Pov from home. Cc of fish bone in throat for two weeks. Says its starting to feel worse.  Says its feels irritated.  7/10  Also wanted to let staff know his left shoulder hurts from playing with his kids. 10/10  Had (2) 40oz beers and cocaine 10 hours ago.

## 2024-04-08 NOTE — Discharge Instructions (Addendum)
 You were seen for your throat pain in the emergency department.  It is likely that the bone that was lodged in your throat has passed and you are having some irritation from it.  This will likely heal up on its own.  At home, please take the lidocaine  for your pain.    Check your MyChart online for the results of any tests that had not resulted by the time you left the emergency department.   Follow-up with your primary doctor in 2-3 days regarding your visit.  Follow-up with ENT in 1 to 2 weeks if you are having persistent symptoms.  Follow-up with orthopedics or sports medicine about your shoulder.  Return immediately to the emergency department if you experience any of the following: Difficulty breathing, inability to swallow, or any other concerning symptoms.    Thank you for visiting our Emergency Department. It was a pleasure taking care of you today.

## 2024-04-08 NOTE — ED Provider Notes (Signed)
  Physical Exam  BP 111/80   Pulse 85   Temp 98.9 F (37.2 C) (Oral)   Resp 17   Ht 5\' 5"  (1.651 m)   Wt 81.6 kg   SpO2 95%   BMI 29.95 kg/m   Physical Exam  Procedures  Procedures  ED Course / MDM   Clinical Course as of 04/08/24 0841  Sat Apr 08, 2024  0658 Assumed care from Dr Luberta Ruse. 48 yo M who thinks he swallowed a fish bone 2 weeks ago moved yesterday but worse with taking a draw off his cigarette. Tolerating secretions. Also has AC separation from a fall recently as well.  [RP]  0840 Patient reexamined.  No stridor.  Tolerating secretions well.  Pain improved with lidocaine .  Had CT of the neck that did not show any acute findings.  Was informed of the incidental findings on the CT.  Will have him follow-up with his primary doctor and ENT regarding his throat pain if it does not improve.  Also given lidocaine  prescription for this.  Will have him follow-up with sports medicine regarding his shoulder pain and AC joint injury.  Return precautions discussed prior to discharge. [RP]    Clinical Course User Index [RP] Ninetta Basket, MD   Medical Decision Making Amount and/or Complexity of Data Reviewed Labs: ordered. Radiology: ordered.  Risk Prescription drug management.      Ninetta Basket, MD 04/08/24 [phone]

## 2024-07-28 ENCOUNTER — Other Ambulatory Visit (HOSPITAL_COMMUNITY): Payer: Self-pay

## 2024-07-28 ENCOUNTER — Other Ambulatory Visit: Payer: Self-pay

## 2024-12-26 ENCOUNTER — Other Ambulatory Visit: Payer: Self-pay

## 2024-12-27 ENCOUNTER — Ambulatory Visit (INDEPENDENT_AMBULATORY_CARE_PROVIDER_SITE_OTHER): Payer: Self-pay | Admitting: Primary Care

## 2024-12-27 ENCOUNTER — Other Ambulatory Visit: Payer: Self-pay

## 2024-12-27 ENCOUNTER — Encounter (INDEPENDENT_AMBULATORY_CARE_PROVIDER_SITE_OTHER): Payer: Self-pay | Admitting: Primary Care

## 2024-12-27 VITALS — BP 112/73 | HR 80 | Resp 16 | Ht 66.0 in | Wt 197.0 lb

## 2024-12-27 DIAGNOSIS — E119 Type 2 diabetes mellitus without complications: Secondary | ICD-10-CM

## 2024-12-27 DIAGNOSIS — Z1211 Encounter for screening for malignant neoplasm of colon: Secondary | ICD-10-CM

## 2024-12-27 DIAGNOSIS — R351 Nocturia: Secondary | ICD-10-CM | POA: Diagnosis not present

## 2024-12-27 DIAGNOSIS — H539 Unspecified visual disturbance: Secondary | ICD-10-CM | POA: Diagnosis not present

## 2024-12-27 NOTE — Progress Notes (Signed)
 "  New Patient Office Visit  Subjective    Patient ID: Cristian White male  DOB: 01-26-1976  Age: 49 y.o. MRN: [id number]   CC:  Establish /ED HPI     Establish Care    Additional comments: Re-establish pt was last seen 03/31/22   Dm  Anxiety/depression      Last edited by Casimir Juvenal JONELLE, RMA on 12/27/2024  3:53 PM.        Cristian White is a 49 year old obese  male who presents today to reestablish care.  He previously lived in [place]  and requesting medication refill .  Type 2 diabetes out of insulin  at this time does voice concerns about possible problems with her kidneys explained would get labs to further evaluate and determine what type of medication he needs to be on for treatment.  Denies increase in polydipsia, polyuria, polyphasia having problems with blurry vision changes. ED address on f/u visit   Medications Ordered Prior to Encounter[1]   Allergies[2]  Past Medical History:  Diagnosis Date   Diabetes mellitus without complication (HCC)    Hyperlipemia    Hypertension      No past surgical history on file.   Family History  Problem Relation Age of Onset   Sudden Cardiac Death Neg Hx     Social History   Socioeconomic History   Marital status: Single    Spouse name: Not on file   Number of children: Not on file   Years of education: Not on file   Highest education level: Not on file  Occupational History   Not on file  Tobacco Use   Smoking status: Every Day    Current packs/day: 1.00    Types: Cigarettes   Smokeless tobacco: Never  Vaping Use   Vaping status: Never Used  Substance and Sexual Activity   Alcohol use: Yes    Comment: 2 40 beer   Drug use: Yes    Types: Cocaine    Comment: 10 hours ago   Sexual activity: Yes    Birth control/protection: Condom    Comment: SAME PARTNER  Other Topics Concern   Not on file  Social History Narrative   ** Merged History Encounter **       Social Drivers of Health   Tobacco Use: High Risk  (12/27/2024)   Patient History    Smoking Tobacco Use: Every Day    Smokeless Tobacco Use: Never    Passive Exposure: Not on file  Financial Resource Strain: Not on file  Food Insecurity: Not on file  Transportation Needs: Not on file  Physical Activity: Not on file  Stress: Not on file  Social Connections: Not on file  Intimate Partner Violence: Not on file  Depression (PHQ2-9): High Risk (12/27/2024)   Depression (PHQ2-9)    PHQ-2 Score: 27  Alcohol Screen: Not on file  Housing: Not on file  Utilities: Not on file  Health Literacy: Not on file    SDOH Interventions Today    Flowsheet Row Most Recent Value  SDOH Interventions   Depression Interventions/Treatment  Counseling     Health Maintenance  Topic Date Due   Hepatitis C Screening  Never done   Pneumococcal Vaccine (1 of 2 - PCV) Never done   Hepatitis B Vaccine (1 of 3 - 19+ 3-dose series) Never done   Colon Cancer Screening  Never done   Flu Shot  Never done   COVID-19 Vaccine (3 - 2025-26 season) 07/24/2024  DTaP/Tdap/Td vaccine (3 - Td or Tdap) 04/03/2027   HPV Vaccine (No Doses Required) Completed   HIV Screening  Completed   Meningitis B Vaccine  Aged Out    Objective    BP 112/73   Pulse 80   Resp 16   Ht 5' 6 (1.676 m)   Wt 197 lb (89.4 kg)   SpO2 99%   BMI 31.80 kg/m     Physical Exam Vitals reviewed.  Constitutional:      Appearance: He is obese.  HENT:     Head: Normocephalic.     Right Ear: Tympanic membrane and external ear normal.     Left Ear: Tympanic membrane and external ear normal.     Nose: Nose normal.  Eyes:     Extraocular Movements: Extraocular movements intact.     Pupils: Pupils are equal, round, and reactive to light.  Cardiovascular:     Rate and Rhythm: Normal rate and regular rhythm.  Pulmonary:     Effort: Pulmonary effort is normal.     Breath sounds: Normal breath sounds.  Abdominal:     General: Bowel sounds are normal. There is distension.     Palpations:  Abdomen is soft.  Musculoskeletal:        General: Normal range of motion.  Skin:    General: Skin is warm and dry.  Neurological:     Mental Status: He is oriented to person, place, and time.  Psychiatric:        Mood and Affect: Mood normal.        Behavior: Behavior normal.        Thought Content: Thought content normal.        Judgment: Judgment normal.        Assessment & Plan:   Jonus was seen today for establish care and erectile dysfunction.  Diagnoses and all orders for this visit:  Type 2 diabetes mellitus without complication, with long-term current use of insulin  (HCC)  Complications from uncontrolled diabetes -diabetic retinopathy leading to blindness, diabetic nephropathy leading to dialysis, decrease in circulation decrease in sores or wound healing which may lead to amputations and increase of heart attack and stroke . Effects on the liver- non-alcoholic fatty liver disease (NAFLD), which can progress to non-alcoholic steatohepatitis (NASH), cirrhosis, liver failure, and even liver cancer , lead to fat accumulation in the liver, causing NAFLD, and if left untreated, can lead to more severe liver damage.  -     CBC with Differential/Platelet; Future -     CMP14+EGFR; Future -     Hemoglobin A1c; Future -     Lipid panel; Future -     Microalbumin / creatinine urine ratio; Future  Vision changes -     Ambulatory referral to Ophthalmology   Colon cancer screening -     Ambulatory referral to Gastroenterology  Nocturia -     PSA; Future     Follow-up:  To be determine after blood work  The above assessment and management plan was discussed with the patient. The patient verbalized understanding of and has agreed to the management plan. Patient is aware to call the clinic if symptoms fail to improve or worsen. Patient is aware when to return to the clinic for a follow-up visit. Patient educated on when it is appropriate to go to the emergency department.    Rosaline Bohr, NP-C    [1]  No current outpatient medications on file prior to visit.   No current  facility-administered medications on file prior to visit.  [2]  Allergies Allergen Reactions   Diphenhydramine-Apap (Sleep) Other (See Comments)    Fainting, passes out   "

## 2024-12-28 ENCOUNTER — Other Ambulatory Visit: Payer: Self-pay

## 2024-12-28 ENCOUNTER — Encounter: Payer: Self-pay | Admitting: Pharmacist

## 2024-12-28 ENCOUNTER — Ambulatory Visit: Payer: Self-pay | Admitting: Pharmacist

## 2024-12-28 ENCOUNTER — Telehealth: Payer: Self-pay | Admitting: Pharmacist

## 2024-12-28 DIAGNOSIS — E119 Type 2 diabetes mellitus without complications: Secondary | ICD-10-CM | POA: Insufficient documentation

## 2024-12-28 DIAGNOSIS — R351 Nocturia: Secondary | ICD-10-CM

## 2024-12-28 MED ORDER — ACCU-CHEK SOFTCLIX LANCETS MISC
6 refills | Status: AC
  Filled 2024-12-28: qty 100, 33d supply, fill #0

## 2024-12-28 MED ORDER — METFORMIN HCL ER 500 MG PO TB24
ORAL_TABLET | ORAL | 2 refills | Status: AC
  Filled 2024-12-28: qty 60, 33d supply, fill #0

## 2024-12-28 MED ORDER — ACCU-CHEK GUIDE TEST VI STRP
ORAL_STRIP | 6 refills | Status: AC
  Filled 2024-12-28: qty 100, 33d supply, fill #0

## 2024-12-28 MED ORDER — PEN NEEDLES 32G X 4 MM MISC
2 refills | Status: AC
  Filled 2024-12-28: qty 100, 90d supply, fill #0

## 2024-12-28 MED ORDER — ACCU-CHEK GUIDE ME W/DEVICE KIT
PACK | 0 refills | Status: AC
  Filled 2024-12-28: qty 1, 30d supply, fill #0

## 2024-12-28 MED ORDER — FREESTYLE LIBRE 3 READER DEVI
0 refills | Status: AC
  Filled 2024-12-28: qty 1, 90d supply, fill #0

## 2024-12-28 MED ORDER — LANTUS SOLOSTAR 100 UNIT/ML ~~LOC~~ SOPN
10.0000 [IU] | PEN_INJECTOR | Freq: Every day | SUBCUTANEOUS | 2 refills | Status: AC
  Filled 2024-12-28: qty 9, 84d supply, fill #0

## 2024-12-28 MED ORDER — FREESTYLE LIBRE 3 PLUS SENSOR MISC
6 refills | Status: AC
  Filled 2024-12-28: qty 2, 30d supply, fill #0

## 2024-12-28 NOTE — Telephone Encounter (Signed)
 Can we start a PA for [place]? Patient has Medicaid and is on insulin .

## 2024-12-28 NOTE — Progress Notes (Cosign Needed)
 "   S:     No chief complaint on file.  49 y.o. male who presents for diabetes evaluation, education, and management. Patient arrives in good spirits and presents without any assistance.   Patient was referred and last seen by Primary Care Provider, NP Rosaline Bohr, on 12/27/2024.   PMH is significant for T2DM and hyperlipidemia. Patient reports diabetes was diagnosed in 202. Blurry vision at the time of diagnosis made him present to the ED. CBG in the ED was >400 mg/dL. Was admitted and discharged from the hospital with PO therapy and insulin .   Before seeing Rosaline yesterday, he was last seen in 2023. Has not been taking medications as he has been without medical care. He has labs to complete today per Michelle's orders and these include an A1c. He has not been able to check blood sugar at home routinely. He is interested in a CGM.    Family/Social History:  -Fhx: none -Tobacco: current 1 PPD smoker  -Alcohol: none reported   Current diabetes medications include: none Current hypertension medications include: none Current hyperlipidemia medications include: none  Patient reports adherence to taking all medications as prescribed.  Insurance coverage: [place] Medicaid, [facility]  Patient denies hypoglycemic events.  Patient reports nocturia (nighttime urination).  Patient reports neuropathy (nerve pain). Patient reports visual changes. Patient reports self foot exams.   O:  No GM or CGM present at today's visit.   Lab Results  Component Value Date   HGBA1C 6.9 (A) 03/31/2022   There were no vitals filed for this visit.  Lipid Panel     Component Value Date/Time   CHOL 312 (H) 01/01/2022 1015   TRIG 989 (HH) 01/01/2022 1015   HDL 27 (L) 01/01/2022 1015   CHOLHDL 11.6 (H) 01/01/2022 1015   LDLCALC Comment (A) 01/01/2022 1015    Clinical Atherosclerotic Cardiovascular Disease (ASCVD): No  The 10-year ASCVD risk score (Arnett DK, et al., 2019) is: 15.5%   Values used  to calculate the score:     Age: 20 years     Clinically relevant sex: Male     Is Non-Hispanic African American: Yes     Diabetic: Yes     Tobacco smoker: Yes     Systolic Blood Pressure: 112 mmHg     Is BP treated: No     HDL Cholesterol: 27 mg/dL     Total Cholesterol: 312 mg/dL  Lab Results  Component Value Date   CHOL 312 (H) 01/01/2022   HDL 27 (L) 01/01/2022   LDLCALC Comment (A) 01/01/2022   TRIG 989 (HH) 01/01/2022   CHOLHDL 11.6 (H) 01/01/2022    Lab Results  Component Value Date   CREATININE 0.96 04/08/2024   BUN 10 04/08/2024   NA 136 04/08/2024   K 4.1 04/08/2024   CL 103 04/08/2024   CO2 24 04/08/2024    Medications Reviewed Today     Reviewed by Fleeta Tonia Garnette LITTIE, RPH-CPP (Pharmacist) on 12/28/24 at 1630  Med List Status: <None>   Medication Order Taking? Sig Documenting Provider Last Dose Status Informant  Accu-Chek Softclix Lancets lancets [id number] Yes Use to check blood sugar 3 times daily. Newlin, Enobong, MD  Active   Blood Glucose Monitoring Suppl (ACCU-CHEK GUIDE ME) w/Device KIT [id number] Yes Use to check blood sugar 3 times daily. Newlin, Enobong, MD  Active   Continuous Glucose Receiver (FREESTYLE LIBRE 3 READER) DEVI [id number] Yes Use to check blood sugar continuously throughout the day, Newlin, Enobong, MD  Active   Continuous Glucose Sensor (FREESTYLE LIBRE 3 PLUS SENSOR) MISC [id number] Yes Change sensor every 15 days. Use to check blood sugar continuously. Newlin, Enobong, MD  Active   glucose blood (ACCU-CHEK GUIDE TEST) test strip [id number] Yes Use to check blood sugar 3 times daily. Newlin, Enobong, MD  Active   insulin  glargine (LANTUS  SOLOSTAR) 100 UNIT/ML Solostar Pen [id number] Yes Inject 10 Units into the skin daily. Newlin, Enobong, MD  Active   Insulin  Pen Needle (PEN NEEDLES) 32G X 4 MM MISC [id number] Yes Use to inject insulin  once daily. Newlin, Enobong, MD  Active   metFORMIN  (GLUCOPHAGE -XR) 500 MG 24 hr tablet [id number] Yes Take  1 tablet (500 mg total) by mouth every morning for 7 days, THEN 2 tablets (1,000 mg total) every morning for 26 days. Newlin, Enobong, MD  Active               Patient is participating in a Managed Medicaid Plan: no   A/P: Diabetes longstanding currently uncontrolled secondary to being off medication. Patient is symptomatic today from a hyperglycemia standpoint. He is not currently hypoglycemic but is able to verbalize appropriate hypoglycemia management plan. We will restart metformin  and basal insulin  today. I will also send in Accu Chek Guide supplies and pursue PA approval for CGM. - Start Lantus  10 units once daily.  - Pen needles sent.  - Start metformin  500 mg XR once daily. After 7 days, increase to 2 tablets (1000 mg total) once daily.  - Accu Chek Guide materials sent.  - Libre 3 plus sensors sent. Will pursue PA approval.  - Patient educated on purpose, proper use, and potential adverse effects metformin , glargine insulin .  - Extensively discussed pathophysiology of diabetes, recommended lifestyle interventions, dietary effects on glucose control.  - Counseled on s/sx of and management of hypoglycemia.  - Next A1c anticipated 03/2025.   Written patient instructions provided. Patient verbalized understanding of treatment plan.  Total time in face to face counseling 30 minutes.    Follow-up:  Pharmacist visit in 4 weeks.  Herlene Fleeta Morris, PharmD, JAQUELINE, CPP Clinical Pharmacist [facility] [phone]   "

## 2024-12-29 LAB — CBC WITH DIFFERENTIAL/PLATELET
Basophils Absolute: 0.1 10*3/uL (ref 0.0–0.2)
Basos: 1 %
EOS (ABSOLUTE): 0.2 10*3/uL (ref 0.0–0.4)
Eos: 2 %
Hematocrit: 44.3 % (ref 37.5–51.0)
Hemoglobin: 14.8 g/dL (ref 13.0–17.7)
Immature Grans (Abs): 0 10*3/uL (ref 0.0–0.1)
Immature Granulocytes: 0 %
Lymphocytes Absolute: 4 10*3/uL — ABNORMAL HIGH (ref 0.7–3.1)
Lymphs: 43 %
MCH: 30.4 pg (ref 26.6–33.0)
MCHC: 33.4 g/dL (ref 31.5–35.7)
MCV: 91 fL (ref 79–97)
Monocytes Absolute: 0.9 10*3/uL (ref 0.1–0.9)
Monocytes: 10 %
Neutrophils Absolute: 4.1 10*3/uL (ref 1.4–7.0)
Neutrophils: 44 %
Platelets: 203 10*3/uL (ref 150–450)
RBC: 4.87 x10E6/uL (ref 4.14–5.80)
RDW: 12.1 % (ref 11.6–15.4)
WBC: 9.3 10*3/uL (ref 3.4–10.8)

## 2024-12-29 LAB — LIPID PANEL
Chol/HDL Ratio: 4.8 ratio (ref 0.0–5.0)
Cholesterol, Total: 250 mg/dL — ABNORMAL HIGH (ref 100–199)
HDL: 52 mg/dL
LDL Chol Calc (NIH): 153 mg/dL — ABNORMAL HIGH (ref 0–99)
Triglycerides: 246 mg/dL — ABNORMAL HIGH (ref 0–149)
VLDL Cholesterol Cal: 45 mg/dL — ABNORMAL HIGH (ref 5–40)

## 2024-12-29 LAB — PSA: Prostate Specific Ag, Serum: 1 ng/mL (ref 0.0–4.0)

## 2024-12-29 LAB — CMP14+EGFR
ALT: 40 [IU]/L (ref 0–44)
AST: 29 [IU]/L (ref 0–40)
Albumin: 4.7 g/dL (ref 4.1–5.1)
Alkaline Phosphatase: 85 [IU]/L (ref 47–123)
BUN/Creatinine Ratio: 13 (ref 9–20)
BUN: 11 mg/dL (ref 6–24)
Bilirubin Total: 0.5 mg/dL (ref 0.0–1.2)
CO2: 21 mmol/L (ref 20–29)
Calcium: 9.9 mg/dL (ref 8.7–10.2)
Chloride: 100 mmol/L (ref 96–106)
Creatinine, Ser: 0.87 mg/dL (ref 0.76–1.27)
Globulin, Total: 2.4 g/dL (ref 1.5–4.5)
Glucose: 157 mg/dL — ABNORMAL HIGH (ref 70–99)
Potassium: 4.7 mmol/L (ref 3.5–5.2)
Sodium: 137 mmol/L (ref 134–144)
Total Protein: 7.1 g/dL (ref 6.0–8.5)
eGFR: 107 mL/min/{1.73_m2}

## 2024-12-29 LAB — MICROALBUMIN / CREATININE URINE RATIO
Creatinine, Urine: 21.2 mg/dL
Microalb/Creat Ratio: <14 mg/g{creat} (ref 0–29)
Microalbumin, Urine: <3 ug/mL

## 2024-12-29 LAB — HEMOGLOBIN A1C
Est. average glucose Bld gHb Est-mCnc: 174 mg/dL
Hgb A1c MFr Bld: 7.7 % — ABNORMAL HIGH (ref 4.8–5.6)

## 2025-01-25 ENCOUNTER — Ambulatory Visit: Payer: Self-pay | Admitting: Pharmacist

## 2025-03-26 ENCOUNTER — Ambulatory Visit (INDEPENDENT_AMBULATORY_CARE_PROVIDER_SITE_OTHER): Payer: Self-pay | Admitting: Primary Care
